# Patient Record
Sex: Female | Born: 1960 | Race: White | Hispanic: No | Smoking: Current every day smoker
Health system: Southern US, Community
[De-identification: ages and names within clinical notes are randomized; demographics above are authoritative.]

## PROBLEM LIST (undated history)

## (undated) DIAGNOSIS — M1712 Unilateral primary osteoarthritis, left knee: Secondary | ICD-10-CM

## (undated) DIAGNOSIS — F410 Panic disorder [episodic paroxysmal anxiety] without agoraphobia: Secondary | ICD-10-CM

## (undated) DIAGNOSIS — F411 Generalized anxiety disorder: Secondary | ICD-10-CM

## (undated) DIAGNOSIS — G43909 Migraine, unspecified, not intractable, without status migrainosus: Secondary | ICD-10-CM

## (undated) DIAGNOSIS — K529 Noninfective gastroenteritis and colitis, unspecified: Secondary | ICD-10-CM

## (undated) DIAGNOSIS — F329 Major depressive disorder, single episode, unspecified: Secondary | ICD-10-CM

## (undated) HISTORY — DX: Panic disorder (episodic paroxysmal anxiety): F41.0

## (undated) HISTORY — DX: Generalized anxiety disorder: F41.1

## (undated) HISTORY — PX: HIP SURGERY: SHX245

## (undated) HISTORY — DX: Unilateral primary osteoarthritis, left knee: M17.12

## (undated) HISTORY — PX: ABDOMINAL HYSTERECTOMY: SHX81

---
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
## 1999-10-29 ENCOUNTER — Encounter: Payer: Self-pay | Admitting: Family Medicine

## 1999-10-29 ENCOUNTER — Encounter: Admission: RE | Admit: 1999-10-29 | Discharge: 1999-10-29 | Payer: Self-pay | Admitting: Family Medicine

## 2001-07-01 ENCOUNTER — Encounter: Payer: Self-pay | Admitting: Emergency Medicine

## 2001-07-01 ENCOUNTER — Emergency Department (HOSPITAL_COMMUNITY): Admission: EM | Admit: 2001-07-01 | Discharge: 2001-07-01 | Payer: Self-pay | Admitting: Emergency Medicine

## 2001-07-20 ENCOUNTER — Ambulatory Visit (HOSPITAL_COMMUNITY): Admission: RE | Admit: 2001-07-20 | Discharge: 2001-07-20 | Payer: Self-pay | Admitting: Family Medicine

## 2001-07-20 ENCOUNTER — Encounter: Payer: Self-pay | Admitting: Family Medicine

## 2003-04-02 ENCOUNTER — Emergency Department (HOSPITAL_COMMUNITY): Admission: EM | Admit: 2003-04-02 | Discharge: 2003-04-03 | Payer: Self-pay | Admitting: Emergency Medicine

## 2003-04-02 ENCOUNTER — Encounter: Payer: Self-pay | Admitting: *Deleted

## 2003-11-19 ENCOUNTER — Emergency Department (HOSPITAL_COMMUNITY): Admission: EM | Admit: 2003-11-19 | Discharge: 2003-11-19 | Payer: Self-pay | Admitting: Emergency Medicine

## 2003-12-29 ENCOUNTER — Emergency Department (HOSPITAL_COMMUNITY): Admission: EM | Admit: 2003-12-29 | Discharge: 2003-12-29 | Payer: Self-pay | Admitting: Emergency Medicine

## 2010-01-08 ENCOUNTER — Ambulatory Visit: Payer: Self-pay | Admitting: Diagnostic Radiology

## 2010-01-08 ENCOUNTER — Emergency Department (HOSPITAL_BASED_OUTPATIENT_CLINIC_OR_DEPARTMENT_OTHER): Admission: EM | Admit: 2010-01-08 | Discharge: 2010-01-08 | Payer: Self-pay | Admitting: Emergency Medicine

## 2012-07-16 DIAGNOSIS — F329 Major depressive disorder, single episode, unspecified: Secondary | ICD-10-CM

## 2012-07-16 DIAGNOSIS — Z800 Family history of malignant neoplasm of digestive organs: Secondary | ICD-10-CM | POA: Insufficient documentation

## 2012-07-16 HISTORY — DX: Major depressive disorder, single episode, unspecified: F32.9

## 2012-07-20 DIAGNOSIS — F17200 Nicotine dependence, unspecified, uncomplicated: Secondary | ICD-10-CM | POA: Insufficient documentation

## 2012-07-20 DIAGNOSIS — K589 Irritable bowel syndrome without diarrhea: Secondary | ICD-10-CM

## 2012-07-20 DIAGNOSIS — Z803 Family history of malignant neoplasm of breast: Secondary | ICD-10-CM | POA: Insufficient documentation

## 2012-07-20 HISTORY — DX: Irritable bowel syndrome, unspecified: K58.9

## 2013-05-13 DIAGNOSIS — E782 Mixed hyperlipidemia: Secondary | ICD-10-CM

## 2013-05-13 HISTORY — DX: Mixed hyperlipidemia: E78.2

## 2013-06-09 DIAGNOSIS — G44209 Tension-type headache, unspecified, not intractable: Secondary | ICD-10-CM

## 2013-06-09 DIAGNOSIS — N952 Postmenopausal atrophic vaginitis: Secondary | ICD-10-CM | POA: Insufficient documentation

## 2013-06-09 HISTORY — DX: Tension-type headache, unspecified, not intractable: G44.209

## 2014-05-05 DIAGNOSIS — N959 Unspecified menopausal and perimenopausal disorder: Secondary | ICD-10-CM | POA: Insufficient documentation

## 2014-09-30 DIAGNOSIS — G43909 Migraine, unspecified, not intractable, without status migrainosus: Secondary | ICD-10-CM | POA: Insufficient documentation

## 2015-02-22 DIAGNOSIS — K219 Gastro-esophageal reflux disease without esophagitis: Secondary | ICD-10-CM | POA: Insufficient documentation

## 2015-02-22 HISTORY — DX: Gastro-esophageal reflux disease without esophagitis: K21.9

## 2015-04-25 DIAGNOSIS — Z8709 Personal history of other diseases of the respiratory system: Secondary | ICD-10-CM

## 2015-04-25 DIAGNOSIS — R059 Cough, unspecified: Secondary | ICD-10-CM | POA: Insufficient documentation

## 2015-04-25 HISTORY — DX: Personal history of other diseases of the respiratory system: Z87.09

## 2016-11-06 ENCOUNTER — Emergency Department (HOSPITAL_BASED_OUTPATIENT_CLINIC_OR_DEPARTMENT_OTHER)
Admission: EM | Admit: 2016-11-06 | Discharge: 2016-11-06 | Disposition: A | Discharge status: Home / Self Care | Payer: Self-pay | Attending: Emergency Medicine | Admitting: Emergency Medicine

## 2016-11-06 ENCOUNTER — Encounter (HOSPITAL_BASED_OUTPATIENT_CLINIC_OR_DEPARTMENT_OTHER): Payer: Self-pay

## 2016-11-06 ENCOUNTER — Emergency Department (HOSPITAL_BASED_OUTPATIENT_CLINIC_OR_DEPARTMENT_OTHER): Payer: Self-pay

## 2016-11-06 DIAGNOSIS — M25561 Pain in right knee: Secondary | ICD-10-CM | POA: Insufficient documentation

## 2016-11-06 DIAGNOSIS — G8929 Other chronic pain: Secondary | ICD-10-CM | POA: Insufficient documentation

## 2016-11-06 DIAGNOSIS — Z79899 Other long term (current) drug therapy: Secondary | ICD-10-CM | POA: Insufficient documentation

## 2016-11-06 DIAGNOSIS — F17210 Nicotine dependence, cigarettes, uncomplicated: Secondary | ICD-10-CM | POA: Insufficient documentation

## 2016-11-06 HISTORY — DX: Major depressive disorder, single episode, unspecified: F32.9

## 2016-11-06 HISTORY — DX: Migraine, unspecified, not intractable, without status migrainosus: G43.909

## 2016-11-06 MED ORDER — DICLOFENAC SODIUM 1 % TD GEL: 4.0000 g | Freq: Four times a day (QID) | TRANSDERMAL | 0 refills | Status: DC

## 2016-11-06 NOTE — ED Notes (Signed)
Discussed dc instructions with pt. Discussed application of pain ointment per ED providers instructions, also discussed how ice and elevation are also beneficial, opportunity for questions provided
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
## 2016-11-06 NOTE — Discharge Instructions (Signed)
Please read and follow all provided instructions.  Your diagnoses today include:  1. Chronic pain of right knee     Tests performed today include:  Vital signs. See below for your results today.   Medications prescribed:   Voltaren - topical medication for joint pain  Take any prescribed medications only as directed.  Home care instructions:   Follow any educational materials contained in this packet  Follow R.I.C.E. Protocol:  R - rest your injury   I  - use ice on injury without applying directly to skin  C - compress injury with bandage or splint  E - elevate the injury as much as possible  Follow-up instructions: Please follow-up with your primary care provider or the provided orthopedic physician as planned. In this case you may have a more severe injury that requires further care.   Return instructions:   Please return if your toes or feet are numb or tingling, appear gray or blue, or you have severe pain (also elevate the leg and loosen splint or wrap if you were given one)  Please return to the Emergency Department if you experience worsening symptoms.   Please return if you have any other emergent concerns.  Additional Information:  Your vital signs today were: There were no vitals taken for this visit. If your blood pressure (BP) was elevated above 135/85 this visit, please have this repeated by your doctor within one month. --------------
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
## 2017-01-28 ENCOUNTER — Encounter (HOSPITAL_COMMUNITY): Payer: Self-pay | Admitting: *Deleted

## 2017-01-28 ENCOUNTER — Emergency Department (HOSPITAL_COMMUNITY)
Admission: EM | Admit: 2017-01-28 | Discharge: 2017-01-28 | Disposition: A | Discharge status: Home / Self Care | Payer: BLUE CROSS/BLUE SHIELD | Attending: Emergency Medicine | Admitting: Emergency Medicine

## 2017-01-28 ENCOUNTER — Emergency Department (HOSPITAL_COMMUNITY): Payer: BLUE CROSS/BLUE SHIELD

## 2017-01-28 DIAGNOSIS — Z7982 Long term (current) use of aspirin: Secondary | ICD-10-CM | POA: Diagnosis not present

## 2017-01-28 DIAGNOSIS — R1032 Left lower quadrant pain: Secondary | ICD-10-CM | POA: Insufficient documentation

## 2017-01-28 DIAGNOSIS — F17210 Nicotine dependence, cigarettes, uncomplicated: Secondary | ICD-10-CM | POA: Insufficient documentation

## 2017-01-28 LAB — CBC
HCT: 38.7 % (ref 36.0–46.0)
HEMOGLOBIN: 13.3 g/dL (ref 12.0–15.0)
MCH: 34.2 pg — AB (ref 26.0–34.0)
MCHC: 34.4 g/dL (ref 30.0–36.0)
MCV: 99.5 fL (ref 78.0–100.0)
Platelets: 190 10*3/uL (ref 150–400)
RBC: 3.89 MIL/uL (ref 3.87–5.11)
RDW: 12.4 % (ref 11.5–15.5)
WBC: 5.1 10*3/uL (ref 4.0–10.5)

## 2017-01-28 LAB — COMPREHENSIVE METABOLIC PANEL
ALBUMIN: 3.5 g/dL (ref 3.5–5.0)
ALT: 21 U/L (ref 14–54)
ANION GAP: 7 (ref 5–15)
AST: 22 U/L (ref 15–41)
Alkaline Phosphatase: 61 U/L (ref 38–126)
BILIRUBIN TOTAL: 0.6 mg/dL (ref 0.3–1.2)
BUN: 17 mg/dL (ref 6–20)
CO2: 23 mmol/L (ref 22–32)
Calcium: 8.4 mg/dL — ABNORMAL LOW (ref 8.9–10.3)
Chloride: 110 mmol/L (ref 101–111)
Creatinine, Ser: 0.7 mg/dL (ref 0.44–1.00)
GFR calc Af Amer: >60 mL/min (ref >60)
GFR calc non Af Amer: >60 mL/min (ref >60)
GLUCOSE: 101 mg/dL — AB (ref 65–99)
POTASSIUM: 4 mmol/L (ref 3.5–5.1)
SODIUM: 140 mmol/L (ref 135–145)
TOTAL PROTEIN: 6.4 g/dL — AB (ref 6.5–8.1)

## 2017-01-28 LAB — URINALYSIS, ROUTINE W REFLEX MICROSCOPIC
Bilirubin Urine: NEGATIVE
Glucose, UA: NEGATIVE mg/dL
Hgb urine dipstick: NEGATIVE
KETONES UR: NEGATIVE mg/dL
Nitrite: NEGATIVE
PROTEIN: NEGATIVE mg/dL
Specific Gravity, Urine: 1.012 (ref 1.005–1.030)
pH: 5 (ref 5.0–8.0)

## 2017-01-28 LAB — LIPASE, BLOOD: Lipase: 36 U/L (ref 11–51)

## 2017-01-28 MED ORDER — IOPAMIDOL (ISOVUE-300) INJECTION 61%
INTRAVENOUS | Status: AC
  Administered 2017-01-28: 100 mL via INTRAVENOUS
  Filled 2017-01-28: qty 100

## 2017-01-28 MED ORDER — SODIUM CHLORIDE 0.9 % IV BOLUS (SEPSIS)
1000.0000 mL | Freq: Once | INTRAVENOUS | Status: AC
  Administered 2017-01-28: 1000 mL via INTRAVENOUS

## 2017-01-28 MED ORDER — ONDANSETRON 8 MG PO TBDP: 8.0000 mg | ORAL_TABLET | Freq: Three times a day (TID) | ORAL | 0 refills | Status: DC | PRN

## 2017-01-28 MED ORDER — HYDROCODONE-ACETAMINOPHEN 5-325 MG PO TABS: 1.0000 | ORAL_TABLET | Freq: Four times a day (QID) | ORAL | 0 refills | Status: DC | PRN

## 2017-01-28 MED ORDER — ONDANSETRON HCL 4 MG/2ML IJ SOLN
4.0000 mg | Freq: Once | INTRAMUSCULAR | Status: AC
  Administered 2017-01-28: 4 mg via INTRAVENOUS
  Filled 2017-01-28: qty 2

## 2017-01-28 MED ORDER — HYDROMORPHONE HCL 1 MG/ML IJ SOLN
0.5000 mg | Freq: Once | INTRAMUSCULAR | Status: AC
  Administered 2017-01-28: 0.5 mg via INTRAVENOUS
  Filled 2017-01-28: qty 1

## 2017-01-28 NOTE — Discharge Instructions (Signed)
Your CT scan showed a left ovarian cyst, otherwise unremarkable. Follow-up with your OB/GYN regarding this finding. Take Zofran as prescribed as needed for nausea and vomiting. Take Norco for severe pain only. Start with oral fluids, advanced solids as tolerated. Follow-up with your doctor. Return if worsening.

## 2017-01-28 NOTE — ED Triage Notes (Signed)
Per EMS, pt from home complains of LLQ abdominal pain x days. Pt has hx of colitis. Pt complains of weakness, dizziness as well x 5 days. Pt last vomited this morning. Pt states she feels dehydrated. Pt given NS. Pt NSR on 12 lead.   BP 140/81 HR 85 RR 16 97 on RA CB G110

## 2017-01-28 NOTE — ED Notes (Signed)
Bed: WA07 Expected date:  Expected time:  Means of arrival:  Comments: EMS abdominal

## 2017-01-28 NOTE — ED Notes (Signed)
Made request for a urine sample,patient unable to provide one at this time.

## 2017-01-28 NOTE — ED Provider Notes (Signed)
[facility]-EMERGENCY DEPT Provider Note   CSN: [id number] Arrival date & time: 01/28/17  1531     History   Chief Complaint Chief Complaint  Patient presents with  . Abdominal Pain    HPI Nikki Glanzer is a 56 y.o. female.  HPI  Zoeya Gramajo is a 57 y.o. female with a history of colitis, depression, migraine headaches, presents to emergency department complaining of abdominal pain. Patient states her pain started 6 days ago. Reports pain in the left lower quadrant. States 5 days ago had several hours of watery diarrhea which has now resolved. She reports associated nausea vomiting over the last 5 days. She states she is able to keep sips of water down but unable to eat any solids. She states pain is worsening. She reports initially thinking this was just a GI bug, but states this feels more like her colitis which she had 6 months ago and for which she was admitted. Denies following up for colonoscopy after her admission. She has tried taking antacids and MiraLAX with no relief of her symptoms. She reports generalized weakness and feeling of dehydration. She denies any blood in her stool or emesis. She has not had a bowel movement in the last 4 days. She is passing gas. She denies any history of abdominal surgeries other than hysterectomy. She reports subjective fever at home, states her thermometer is broken. Denies any back pain. No urinary symptoms. Nothing making her symptoms better or worse   Past Medical History:  Diagnosis Date  . Depression   . Migraine     There are no active problems to display for this patient.   Past Surgical History:  Procedure Laterality Date  . ABDOMINAL HYSTERECTOMY    . HIP SURGERY      OB History    No data available       Home Medications    Prior to Admission medications   Medication Sig Start Date End Date Taking? Authorizing Provider  Aspirin-Acetaminophen-Caffeine (GOODY HEADACHE PO) Take 1 packet by mouth as needed (pain,headache).    Yes Historical Provider, MD  buPROPion (WELLBUTRIN XL) 300 MG 24 hr tablet Take 300 mg by mouth daily. 11/13/16  Yes Historical Provider, MD  clonazePAM (KLONOPIN) 0.5 MG tablet Take 0.5 mg by mouth 2 (two) times daily as needed for anxiety. 12/09/16  Yes Historical Provider, MD  diclofenac sodium (VOLTAREN) 1 % GEL Apply 4 g topically 4 (four) times daily. 11/06/16  Yes Renne Crigler, PA-C  FLUoxetine (PROZAC) 40 MG capsule Take 40 mg by mouth daily. 11/13/16  Yes Historical Provider, MD  ibuprofen (ADVIL,MOTRIN) 200 MG tablet Take 400-600 mg by mouth every 6 (six) hours as needed.   Yes Historical Provider, MD    Family History No family history on file.  Social History Social History  Substance Use Topics  . Smoking status: Current Every Day Smoker    Types: Cigarettes  . Smokeless tobacco: Never Used  . Alcohol use Yes     Comment: occ     Allergies   Morphine and related and Tramadol   Review of Systems Review of Systems  Constitutional: Positive for chills, fatigue and fever.  Respiratory: Negative for cough, chest tightness and shortness of breath.   Cardiovascular: Negative for chest pain, palpitations and leg swelling.  Gastrointestinal: Positive for abdominal pain, constipation, diarrhea, nausea and vomiting.  Genitourinary: Negative for dysuria, flank pain, pelvic pain, vaginal bleeding, vaginal discharge and vaginal pain.  Musculoskeletal: Negative for arthralgias, myalgias, neck pain  and neck stiffness.  Skin: Negative for rash.  Neurological: Positive for dizziness and weakness. Negative for headaches.  All other systems reviewed and are negative.    Physical Exam Updated Vital Signs BP (!) 141/91 (BP Location: Right Arm)   Pulse 85   Temp 98.1 F (36.7 C)   Resp 18   SpO2 100%   Physical Exam  Constitutional: She is oriented to person, place, and time. She appears well-developed and well-nourished. No distress.  HENT:  Head: Normocephalic.  Eyes:  Conjunctivae are normal.  Neck: Neck supple.  Cardiovascular: Normal rate, regular rhythm and normal heart sounds.   Pulmonary/Chest: Effort normal and breath sounds normal. No respiratory distress. She has no wheezes. She has no rales.  Abdominal: Soft. Bowel sounds are normal. She exhibits no distension. There is tenderness. There is no rebound.  LLQ tenderness. No guarding. No cva tenderness bilaterally  Musculoskeletal: She exhibits no edema.  Neurological: She is alert and oriented to person, place, and time.  Skin: Skin is warm and dry.  Psychiatric: She has a normal mood and affect. Her behavior is normal.  Nursing note and vitals reviewed.    ED Treatments / Results  Labs (all labs ordered are listed, but only abnormal results are displayed) Labs Reviewed  COMPREHENSIVE METABOLIC PANEL - Abnormal; Notable for the following:       Result Value   Glucose, Bld 101 (*)    Calcium 8.4 (*)    Total Protein 6.4 (*)    All other components within normal limits  CBC - Abnormal; Notable for the following:    MCH 34.2 (*)    All other components within normal limits  LIPASE, BLOOD  URINALYSIS, ROUTINE W REFLEX MICROSCOPIC    EKG  EKG Interpretation None       Radiology No results found.  Procedures Procedures (including critical care time)  Medications Ordered in ED Medications  ondansetron (ZOFRAN) injection 4 mg (not administered)  HYDROmorphone (DILAUDID) injection 0.5 mg (not administered)  sodium chloride 0.9 % bolus 1,000 mL (not administered)     Initial Impression / Assessment and Plan / ED Course  I have reviewed the triage vital signs and the nursing notes.  Pertinent labs & imaging results that were available during my care of the patient were reviewed by me and considered in my medical decision making (see chart for details).     Pt in ED with left lower quadrant abd pain, nausea, vomiting. Hx of colitis. Will get labs, UA, CT abd/pelvs. Fluids,  anti emetics, pain medications ordered.   6:12 PM Labs unremarkable. Patient feels better after 0.5 mg of IV Dilaudid. Patient is waiting on CT scan.  7:38 PM Pt's CT scan showing left ovarian cyst, which pt is aware of and follows up with OB./GYN. CT otherwise unremarkable. She is feeling better. She tolerated POs. Her white blood cell count is normal. No evidence of UTI. Does not have uterus, denies any vaginal complaints. Plan to dc home with pain medications, antiemetics, follow up with pcp.   Vitals:   01/28/17 1541 01/28/17 1929  BP: (!) 141/91 (!) 139/96  Pulse: 85 75  Resp: 18 14  Temp: 98.1 F (36.7 C)   SpO2: 100% 100%     Final Clinical Impressions(s) / ED Diagnoses   Final diagnoses:  Left lower quadrant pain    New Prescriptions New Prescriptions   No medications on file     Jaynie Crumble, PA-C 01/28/17 1940  Azalia Bilis, MD 01/29/17 1057

## 2017-03-09 ENCOUNTER — Emergency Department (HOSPITAL_COMMUNITY): Payer: BLUE CROSS/BLUE SHIELD

## 2017-03-09 ENCOUNTER — Encounter (HOSPITAL_COMMUNITY): Payer: Self-pay | Admitting: Emergency Medicine

## 2017-03-09 ENCOUNTER — Emergency Department (HOSPITAL_COMMUNITY)
Admission: EM | Admit: 2017-03-09 | Discharge: 2017-03-09 | Disposition: A | Discharge status: Home / Self Care | Payer: BLUE CROSS/BLUE SHIELD | Attending: Emergency Medicine | Admitting: Emergency Medicine

## 2017-03-09 DIAGNOSIS — K2950 Unspecified chronic gastritis without bleeding: Secondary | ICD-10-CM

## 2017-03-09 DIAGNOSIS — R197 Diarrhea, unspecified: Secondary | ICD-10-CM

## 2017-03-09 DIAGNOSIS — Z7982 Long term (current) use of aspirin: Secondary | ICD-10-CM | POA: Diagnosis not present

## 2017-03-09 DIAGNOSIS — N83202 Unspecified ovarian cyst, left side: Secondary | ICD-10-CM | POA: Diagnosis not present

## 2017-03-09 DIAGNOSIS — R1032 Left lower quadrant pain: Secondary | ICD-10-CM

## 2017-03-09 DIAGNOSIS — Z79899 Other long term (current) drug therapy: Secondary | ICD-10-CM | POA: Diagnosis not present

## 2017-03-09 DIAGNOSIS — F17210 Nicotine dependence, cigarettes, uncomplicated: Secondary | ICD-10-CM | POA: Diagnosis not present

## 2017-03-09 DIAGNOSIS — R112 Nausea with vomiting, unspecified: Secondary | ICD-10-CM

## 2017-03-09 HISTORY — DX: Noninfective gastroenteritis and colitis, unspecified: K52.9

## 2017-03-09 LAB — URINALYSIS, ROUTINE W REFLEX MICROSCOPIC
BILIRUBIN URINE: NEGATIVE
Glucose, UA: NEGATIVE mg/dL
Hgb urine dipstick: NEGATIVE
KETONES UR: 5 mg/dL — AB
LEUKOCYTES UA: NEGATIVE
NITRITE: NEGATIVE
PH: 5 (ref 5.0–8.0)
Protein, ur: NEGATIVE mg/dL
SPECIFIC GRAVITY, URINE: 1.018 (ref 1.005–1.030)

## 2017-03-09 LAB — COMPREHENSIVE METABOLIC PANEL
ALK PHOS: 71 U/L (ref 38–126)
ALT: 15 U/L (ref 14–54)
ANION GAP: 9 (ref 5–15)
AST: 20 U/L (ref 15–41)
Albumin: 4.2 g/dL (ref 3.5–5.0)
BILIRUBIN TOTAL: 0.8 mg/dL (ref 0.3–1.2)
BUN: 17 mg/dL (ref 6–20)
CALCIUM: 9.6 mg/dL (ref 8.9–10.3)
CO2: 25 mmol/L (ref 22–32)
Chloride: 108 mmol/L (ref 101–111)
Creatinine, Ser: 0.77 mg/dL (ref 0.44–1.00)
GFR calc non Af Amer: >60 mL/min (ref >60)
Glucose, Bld: 95 mg/dL (ref 65–99)
POTASSIUM: 4.1 mmol/L (ref 3.5–5.1)
Sodium: 142 mmol/L (ref 135–145)
TOTAL PROTEIN: 7.2 g/dL (ref 6.5–8.1)

## 2017-03-09 LAB — CBC
HEMATOCRIT: 41.7 % (ref 36.0–46.0)
HEMOGLOBIN: 14.6 g/dL (ref 12.0–15.0)
MCH: 35.1 pg — ABNORMAL HIGH (ref 26.0–34.0)
MCHC: 35 g/dL (ref 30.0–36.0)
MCV: 100.2 fL — ABNORMAL HIGH (ref 78.0–100.0)
Platelets: 232 10*3/uL (ref 150–400)
RBC: 4.16 MIL/uL (ref 3.87–5.11)
RDW: 12.5 % (ref 11.5–15.5)
WBC: 8 10*3/uL (ref 4.0–10.5)

## 2017-03-09 LAB — LIPASE, BLOOD: Lipase: 29 U/L (ref 11–51)

## 2017-03-09 MED ORDER — FAMOTIDINE IN NACL 20-0.9 MG/50ML-% IV SOLN
20.0000 mg | Freq: Once | INTRAVENOUS | Status: AC
  Administered 2017-03-09: 20 mg via INTRAVENOUS
  Filled 2017-03-09: qty 50

## 2017-03-09 MED ORDER — ONDANSETRON 4 MG PO TBDP: 4.0000 mg | ORAL_TABLET | Freq: Three times a day (TID) | ORAL | 0 refills | Status: DC | PRN

## 2017-03-09 MED ORDER — SODIUM CHLORIDE 0.9 % IV BOLUS (SEPSIS)
1000.0000 mL | Freq: Once | INTRAVENOUS | Status: AC
  Administered 2017-03-09: 1000 mL via INTRAVENOUS

## 2017-03-09 MED ORDER — ONDANSETRON HCL 4 MG/2ML IJ SOLN
4.0000 mg | Freq: Once | INTRAMUSCULAR | Status: AC
  Administered 2017-03-09: 4 mg via INTRAVENOUS
  Filled 2017-03-09: qty 2

## 2017-03-09 MED ORDER — GI COCKTAIL ~~LOC~~
30.0000 mL | Freq: Once | ORAL | Status: AC
  Administered 2017-03-09: 30 mL via ORAL
  Filled 2017-03-09: qty 30

## 2017-03-09 MED ORDER — RANITIDINE HCL 150 MG PO TABS: 150.0000 mg | ORAL_TABLET | Freq: Two times a day (BID) | ORAL | 0 refills | Status: DC

## 2017-03-09 MED ORDER — DICYCLOMINE HCL 10 MG/ML IM SOLN
20.0000 mg | Freq: Once | INTRAMUSCULAR | Status: AC
  Administered 2017-03-09: 20 mg via INTRAMUSCULAR
  Filled 2017-03-09: qty 2

## 2017-03-09 MED ORDER — DICYCLOMINE HCL 20 MG PO TABS: 20.0000 mg | ORAL_TABLET | Freq: Two times a day (BID) | ORAL | 0 refills | Status: DC

## 2017-03-09 MED ORDER — KETOROLAC TROMETHAMINE 30 MG/ML IJ SOLN
15.0000 mg | Freq: Once | INTRAMUSCULAR | Status: AC
  Administered 2017-03-09: 15 mg via INTRAVENOUS
  Filled 2017-03-09: qty 1

## 2017-03-09 NOTE — ED Notes (Signed)
Patient reports having lower left ovarian pain, due to a cyst.  She is in constant pain.

## 2017-03-09 NOTE — Discharge Instructions (Signed)
You have an ovarian cyst, which is just slightly larger than your last ultrasound, however it's overall stable and just needs to be monitored. It could be contributing to your pain. Use heat to the area of pain. Follow up with your OBGYN in 1 week for recheck of symptoms and ongoing management of your cyst. [facility]) for any emergent changes or worsening symptoms.   Your abdominal pain could also be related to gastritis or an ulcer, or even something called IBS (irritable bowel syndrome). You will need to take zantac as directed, and avoid spicy/fatty/acidic foods, avoid soda/coffee/tea/alcohol. Avoid laying down flat within 30 minutes of eating. Avoid NSAIDs like ibuprofen/aleve/motrin/etc on an empty stomach. May consider using over the counter tums/maalox as needed for additional relief. Use zofran as directed as needed for nausea. Use tylenol and bentyl as directed as needed for pain and diarrhea. Stay well hydrated with small sips of fluids throughout the day. Follow a BRAT (banana-rice-applesauce-toast) diet as described below for the next 24-48 hours. The 'BRAT' diet is suggested, then progress to diet as tolerated as symptoms abate. Call your regular doctor if bloody stools, persistent diarrhea, vomiting, fever or abdominal pain. Follow up with your regular doctor in 1 week for recheck of symptoms, and with your surgeon in order to have your colonoscopy done. Return to ER for changing or worsening of symptoms.   Abdominal (belly) pain can be caused by many things. Your caregiver performed an examination and possibly ordered blood/urine tests and imaging (CT scan, x-rays, ultrasound). Many cases can be observed and treated at home after initial evaluation in the emergency department. Even though you are being discharged home, abdominal pain can be unpredictable. Therefore, you need a repeated exam if your pain does not resolve, returns, or worsens. Most patients  with abdominal pain don't have to be admitted to the hospital or have surgery, but serious problems like appendicitis and gallbladder attacks can start out as nonspecific pain. Many abdominal conditions cannot be diagnosed in one visit, so follow-up evaluations are very important. SEEK IMMEDIATE MEDICAL ATTENTION IF YOU DEVELOP ANY OF THE FOLLOWING SYMPTOMS: The pain does not go away or becomes severe.  A temperature above 101 develops.  Repeated vomiting occurs (multiple episodes).  The pain becomes localized to portions of the abdomen. The right side could possibly be appendicitis. In an adult, the left lower portion of the abdomen could be colitis or diverticulitis.  Blood is being passed in stools or vomit (bright red or black tarry stools).  Return also if you develop chest pain, difficulty breathing, dizziness or fainting, or become confused, poorly responsive, or inconsolable (young children). The constipation stays for more than 4 days.  There is belly (abdominal) or rectal pain.  You do not seem to be getting better.

## 2017-03-09 NOTE — ED Provider Notes (Signed)
[facility]-EMERGENCY DEPT Provider Note   CSN: [id number] Arrival date & time: 03/09/17  1221     History   Chief Complaint No chief complaint on file.   HPI Allison Le is a 56 y.o. female with a PMHx of L ovarian cyst, colitis, migraines, and depression, with a PSHx of abd hysterectomy, who presents to the ED with complaints of recurrent left lower quadrant pain that began 2 days ago, with associated nausea, vomiting, and diarrhea. Patient states this has been a recurrent issue for the last 9 months, and she believes that she has either colitis or her left ovarian cyst is causing problems. Patient states that 9 months ago she was hospitalized in [facility] for "colitis" that was treated with antibiotics before she was discharged with instructions to follow-up for colonoscopy. Chart review reveals that she went to general surgery at [facility] health care in 12/2016 for consultation for a colonoscopy, but never actually had it done, which she states was due to financial issues. She states that she has not followed up with anybody for her recurrent pain, and has never seen a gastroenterologist. She states that the pain typically starts after she eats food, and then becomes a cycle of having nausea and vomiting, then a sensation to have a bowel movement which causes pain, and after the bowel movement pain improves until the next time that she feels sensation to have a bowel movement again when the pain returns. It always happens in this type of cycle. She reports that she's had 4 episodes of NBNB emesis in the last 24hrs, which always occurs after any food consumption, and she's had ~6-7 episodes of nonbloody diarrhea that usually starts out "hard" and then progresses to a softer "mushy" stool, and then ultimately ending in watery stools. She states it occurs "until the food passes through my system", and once it does, the n/v/d and pain cycle stops. She describes the pain today is 8/10 intermittent crampy  LLQ pain which is nonradiating, worse with eating and eating a bowel movement, and unrelieved with ibuprofen and Goody's powders however improved after having a bowel movement. She also reports that her left ovarian cyst is also causing some achy/throbbing and shooting LLQ pain but that is different than the crampy pain she experiences during the cycles of pain. She states this is the same thing as the last ED visit on 01/28/17. Chart review reveals she was seen in the ED on 01/28/17 and had a CT abd/pelv which revealed mild GE junction thickening likely related to esophageal gastritis, and a 1.7cm L ovarian cyst that recommended nonemergent outpatient pelvic U/S for further evaluation. Pt did not follow up after that visit for any further evaluation of this finding, but states she had a pelvic U/S in [place] prior to that visit and was told that her "ovary needed to come out" (chart review appears to show that she was seen at [facility] care OBGYN 11/14/16, had 1.9cm simple L ovarian cyst, recommended f/up in 6 months vs surgical removal if she wanted, but pt declined at that time).  She has not seen the OBGYN for evaluation of removing this ovary/cyst. She admits to being a social alcohol drinker, last had 1 glass of wine about 3-4 days ago, right before the onset of her symptoms. She also admits to regular NSAID use.   She denies fevers, chills, CP, SOB, constipation, obstipation, melena, hematochezia, hematemesis, hematuria, dysuria, vaginal bleeding/discharge, myalgias, arthralgias, numbness, tingling, focal weakness, or any other  complaints at this time. Denies recent travel, sick contacts, suspicious food intake, or recent abx.    The history is provided by the patient and medical records. No language interpreter was used.  Abdominal Cramping  This is a recurrent problem. The current episode started 2 days ago. The problem occurs daily. The problem has not changed since onset.Associated symptoms include  abdominal pain. Pertinent negatives include no chest pain and no shortness of breath. The symptoms are aggravated by eating (and needing to BM). Relieved by: having BM. Treatments tried: ibuprofen and goody's powders. The treatment provided no relief.    Past Medical History:  Diagnosis Date  . Colitis   . Depression   . Migraine     There are no active problems to display for this patient.   Past Surgical History:  Procedure Laterality Date  . ABDOMINAL HYSTERECTOMY    . HIP SURGERY      OB History    No data available       Home Medications    Prior to Admission medications   Medication Sig Start Date End Date Taking? Authorizing Provider  Aspirin-Acetaminophen-Caffeine (GOODY HEADACHE PO) Take 1 packet by mouth as needed (pain,headache).    [provider]  buPROPion (WELLBUTRIN XL) 300 MG 24 hr tablet Take 300 mg by mouth daily. 11/13/16   [provider]  clonazePAM (KLONOPIN) 0.5 MG tablet Take 0.5 mg by mouth 2 (two) times daily as needed for anxiety. 12/09/16   [provider]  diclofenac sodium (VOLTAREN) 1 % GEL Apply 4 g topically 4 (four) times daily. 11/06/16   Renne Crigler, PA-C  FLUoxetine (PROZAC) 40 MG capsule Take 40 mg by mouth daily. 11/13/16   [provider]  HYDROcodone-acetaminophen (NORCO) 5-325 MG tablet Take 1 tablet by mouth every 6 (six) hours as needed for moderate pain. 01/28/17   Kirichenko, Tatyana, PA-C  ibuprofen (ADVIL,MOTRIN) 200 MG tablet Take 400-600 mg by mouth every 6 (six) hours as needed.    [provider]  ondansetron (ZOFRAN ODT) 8 MG disintegrating tablet Take 1 tablet (8 mg total) by mouth every 8 (eight) hours as needed for nausea or vomiting. 01/28/17   Jaynie Crumble, PA-C    Family History No family history on file.  Social History Social History  Substance Use Topics  . Smoking status: Current Every Day Smoker    Types: Cigarettes  . Smokeless tobacco: Never Used  . Alcohol  use Yes     Comment: occ     Allergies   Morphine and related and Tramadol   Review of Systems Review of Systems  Constitutional: Negative for chills and fever.  Respiratory: Negative for shortness of breath.   Cardiovascular: Negative for chest pain.  Gastrointestinal: Positive for abdominal pain, diarrhea, nausea and vomiting. Negative for blood in stool and constipation.  Genitourinary: Negative for dysuria, hematuria, vaginal bleeding and vaginal discharge.  Musculoskeletal: Negative for arthralgias and myalgias.  Skin: Negative for color change.  Allergic/Immunologic: Negative for immunocompromised state.  Neurological: Negative for weakness and numbness.  Psychiatric/Behavioral: Negative for confusion.   All other systems reviewed and are negative for acute change except as noted in the HPI.    Physical Exam Updated Vital Signs BP 137/90 (BP Location: Left Arm)   Pulse 90   Temp 98 F (36.7 C) (Oral)   Resp 18   SpO2 100%   Physical Exam  Constitutional: She is oriented to person, place, and time. Vital signs are normal. She appears  well-developed and well-nourished.  Non-toxic appearance. No distress.  Afebrile, nontoxic, NAD  HENT:  Head: Normocephalic and atraumatic.  Mouth/Throat: Oropharynx is clear and moist and mucous membranes are normal.  Eyes: Conjunctivae and EOM are normal. Right eye exhibits no discharge. Left eye exhibits no discharge.  Neck: Normal range of motion. Neck supple.  Cardiovascular: Normal rate, regular rhythm, normal heart sounds and intact distal pulses.  Exam reveals no gallop and no friction rub.   No murmur heard. Pulmonary/Chest: Effort normal and breath sounds normal. No respiratory distress. She has no decreased breath sounds. She has no wheezes. She has no rhonchi. She has no rales.  Abdominal: Soft. Normal appearance and bowel sounds are normal. She exhibits no distension. There is tenderness in the epigastric area and left lower  quadrant. There is no rigidity, no rebound, no guarding, no CVA tenderness, no tenderness at McBurney's point and negative Murphy's sign.    Soft, nondistended, +BS throughout, with mild LLQ TTP and epigastric TTP, no r/g/r, neg murphy's, neg mcburney's, no CVA TTP   Genitourinary:  Genitourinary Comments: Pt declined  Musculoskeletal: Normal range of motion.  Neurological: She is alert and oriented to person, place, and time. She has normal strength. No sensory deficit.  Skin: Skin is warm, dry and intact. No rash noted.  Psychiatric: She has a normal mood and affect.  Nursing note and vitals reviewed.    ED Treatments / Results  Labs (all labs ordered are listed, but only abnormal results are displayed) Labs Reviewed  CBC - Abnormal; Notable for the following:       Result Value   MCV 100.2 (*)    MCH 35.1 (*)    All other components within normal limits  URINALYSIS, ROUTINE W REFLEX MICROSCOPIC - Abnormal; Notable for the following:    Ketones, ur 5 (*)    All other components within normal limits  LIPASE, BLOOD  COMPREHENSIVE METABOLIC PANEL    EKG  EKG Interpretation None       Radiology US Transvaginal Non-ob  Result Date: 03/09/2017 CLINICAL DATA:  56 year old female with left pelvic pain. History of hysterectomy. EXAM: TRANSABDOMINAL AND TRANSVAGINAL ULTRASOUND OF PELVIS DOPPLER ULTRASOUND OF OVARIES TECHNIQUE: Both transabdominal and transvaginal ultrasound examinations of the pelvis were performed. Transabdominal technique was performed for global imaging of the pelvis including the ovaries, adnexal regions, and pelvic cul-de-sac. It was necessary to proceed with endovaginal exam following the transabdominal exam to visualize the ovaries and adnexal regions. Color and duplex Doppler ultrasound was utilized to evaluate blood flow to the ovaries. COMPARISON:  01/28/2017 CT FINDINGS: Uterus Not visualized compatible with hysterectomy. Right ovary Measurements: 1.5 x 2.3  x 2 cm. Normal appearance/no adnexal mass. Left ovary Measurements: 2.6 x 2.1 x 2.1 cm. A 2.4 x 1.9 x 1.9 cm cyst with thin septations noted. Pulsed Doppler evaluation of both ovaries demonstrates normal low-resistance arterial and venous waveforms. Other findings No abnormal free fluid. IMPRESSION: No evidence of ovarian torsion. 2.4 cm left ovarian cyst. This is almost certainly benign, but follow up ultrasound is recommended in 1 year according to the Society of Radiologists in Ultrasound2010 Consensus Conference Statement (D Lenis Noon et al. Management of Asymptomatic Ovarian and Other Adnexal Cysts Imaged at [place]: Society of Radiologists in Ultrasound Consensus Conference Statement 2010. Radiology 256 (Sept 2010): 943-954.). Normal right ovary.  Status post hysterectomy. Electronically Signed   By: Harmon Pier M.D.   On: 03/09/2017 15:50   US Pelvis Complete  Result Date: 03/09/2017  CLINICAL DATA:  56 year old female with left pelvic pain. History of hysterectomy. 1XAM: TRANSABDOMINAL AND TRANSVAGINAL ULTRASOUND OF PELVIS DOPPLER ULTRASOUND OF OVARIES TECHNIQUE: Both transabdominal and transvaginal ultrasound examinations of the pelvis were performed. Transabdominal technique was performed for global imaging of the pelvis including the ovaries, adnexal regions, and pelvic cul-de-sac. It was necessary to proceed with endovaginal exam following the transabdominal exam to visualize the ovaries and adnexal regions. Color and duplex Doppler ultrasound was utilized to evaluate blood flow to the ovaries. COMPARISON:  01/28/2017 CT FINDINGS: Uterus Not visualized compatible with hysterectomy. Right ovary Measurements: 1.5 x 2.3 x 2 cm. Normal appearance/no adnexal mass. Left ovary Measurements: 2.6 x 2.1 x 2.1 cm. A 2.4 x 1.9 x 1.9 cm cyst with thin septations noted. Pulsed Doppler evaluation of both ovaries demonstrates normal low-resistance arterial and venous waveforms. Other findings No abnormal free fluid.  IMPRESSION: No evidence of ovarian torsion. 2.4 cm left ovarian cyst. This is almost certainly benign, but follow up ultrasound is recommended in 1 year according to the Society of Radiologists in Ultrasound2010 Consensus Conference Statement (D Lenis NoonLevine et al. Management of Asymptomatic Ovarian and Other Adnexal Cysts Imaged at [place]: Society of Radiologists in Ultrasound Consensus Conference Statement 2010. Radiology 256 (Sept 2010): 943-954.). Normal right ovary.  Status post hysterectomy. Electronically Signed   By: Harmon PierJeffrey  Hu M.D.   On: 03/09/2017 15:50   [place] Art/ven Flow Abd Pelv Doppler  Result Date: 03/09/2017 CLINICAL DATA:  56 year old female with left pelvic pain. History of hysterectomy. EXAM: TRANSABDOMINAL AND TRANSVAGINAL ULTRASOUND OF PELVIS DOPPLER ULTRASOUND OF OVARIES TECHNIQUE: Both transabdominal and transvaginal ultrasound examinations of the pelvis were performed. Transabdominal technique was performed for global imaging of the pelvis including the ovaries, adnexal regions, and pelvic cul-de-sac. It was necessary to proceed with endovaginal exam following the transabdominal exam to visualize the ovaries and adnexal regions. Color and duplex Doppler ultrasound was utilized to evaluate blood flow to the ovaries. COMPARISON:  01/28/2017 CT FINDINGS: Uterus Not visualized compatible with hysterectomy. Right ovary Measurements: 1.5 x 2.3 x 2 cm. Normal appearance/no adnexal mass. Left ovary Measurements: 2.6 x 2.1 x 2.1 cm. A 2.4 x 1.9 x 1.9 cm cyst with thin septations noted. Pulsed Doppler evaluation of both ovaries demonstrates normal low-resistance arterial and venous waveforms. Other findings No abnormal free fluid. IMPRESSION: No evidence of ovarian torsion. 2.4 cm left ovarian cyst. This is almost certainly benign, but follow up ultrasound is recommended in 1 year according to the Society of Radiologists in Ultrasound2010 Consensus Conference Statement (D Lenis NoonLevine et al. Management of  Asymptomatic Ovarian and Other Adnexal Cysts Imaged at [place]: Society of Radiologists in Ultrasound Consensus Conference Statement 2010. Radiology 256 (Sept 2010): [phone].). Normal right ovary.  Status post hysterectomy. Electronically Signed   By: Harmon PierJeffrey  Hu M.D.   On: 03/09/2017 15:50     01/28/17 CT abd/pelv: IMPRESSION: 1. No CT evidence for acute intra-abdominal or pelvic pathology 2. Possible mild thickening at the GE junction, could relate to esophago gastritis. 3. 1.7 cm low-density probable cyst in the left adnexa. Recommend further evaluation with nonemergent pelvic ultrasound.  Electronically Signed   By: Jasmine PangKim  Fujinaga M.D.   On: 01/28/2017 19:19   Procedures Procedures (including critical care time)  Medications Ordered in ED Medications  dicyclomine (BENTYL) injection 20 mg (20 mg Intramuscular Given 03/09/17 1435)  sodium chloride 0.9 % bolus 1,000 mL (1,000 mLs Intravenous New Bag/Given 03/09/17 1435)  ondansetron (ZOFRAN) injection 4 mg (4 mg Intravenous Given  03/09/17 1434)  ketorolac (TORADOL) 30 MG/ML injection 15 mg (15 mg Intravenous Given 03/09/17 1434)  famotidine (PEPCID) IVPB 20 mg premix (0 mg Intravenous Stopped 03/09/17 1509)  gi cocktail (Maalox,Lidocaine,Donnatal) (30 mLs Oral Given 03/09/17 1433)     Initial Impression / Assessment and Plan / ED Course  I have reviewed the triage vital signs and the nursing notes.  Pertinent labs & imaging results that were available during my care of the patient were reviewed by me and considered in my medical decision making (see chart for details).     56 y.o. female here with recurrent LLQ pain and n/v/d that began 2 days ago, seems to be triggered by food, pain cycles around needing to have BM and improves after BM. States she had a hx of colitis 9 months ago at outside hospital, was treated with abx, never had colonoscopy afterwards although she was advised to and actually made appt with gen surg for this back in  April. Was seen here on 01/28/17 for similar complaints, had CT abd/pelv which showed gastritis and 1.7cm L ovarian cyst. Has not yet followed back up since then regarding her cyst. On exam, mild LLQ TTP and epigastric TTP, nonperitoneal, neg murphy's and mcburney's. No flank tenderness. Labs reveal: lipase WNL, CMP WNL, CBC essentially WNL (MCV and MCH high, but remainder of CBC WNL). U/A not yet done. Story sounds most consistent with IBS, with concomitant gastritis as well. Highly doubt need for repeat CT scan today, given reassuring labs and nonsurgical abd exam. Will give bentyl, pepcid, toradol, zofran, fluids, and GI cocktail, and get pelvic/transvaginal U/S. Of note, pt declined pelvic exam, would prefer to just have pelvic U/S done to evaluate the L ovarian cyst, which I think is reasonable since pelvic examination on this pt would likely not add much to her overall assessment. Will await U/A and U/S results then reassess shortly  4:40 PM U/A completely unremarkable aside from 5 ketones, but otherwise no evidence infection or other concerning findings. U/S demonstrating L ovarian cyst again, now 2.4cm which is just slightly larger than the U/S from February done at [facility]; no other concerning findings. Her symptoms are likely related to this, compiled with gastritis/gastroenteritis, and possibly IBS although she'd need colonoscopy first to r/o other etiologies of her recurrent abd pain/n/v/d. Pt feeling somewhat better, nausea resolved, pain slightly improved, and tolerating PO well. Will d/c home with bentyl, zofran, and zantac. Advised staying well hydrated, BRAT diet, diet and lifestyle modifications advised for gastritis/GERD, discussed use of tylenol, sparing NSAID use, avoid alcohol, use heat to abdomen to help with ovarian cyst pain, and f/up with her OBGYN as well as her PCP for recheck of symptoms and ongoing management in 1 week. Also discussed importance of following up with getting her  colonoscopy.  I explained the diagnosis and have given explicit precautions to return to the ER including for any other new or worsening symptoms. The patient understands and accepts the medical plan as it's been dictated and I have answered their questions. Discharge instructions concerning home care and prescriptions have been given. The patient is STABLE and is discharged to home in good condition.    Final Clinical Impressions(s) / ED Diagnoses   Final diagnoses:  Colicky LLQ abdominal pain  Left ovarian cyst  Nausea vomiting and diarrhea  Other chronic gastritis, presence of bleeding unspecified    New Prescriptions New Prescriptions   DICYCLOMINE (BENTYL) 20 MG TABLET    Take 1 tablet (20  mg total) by mouth 2 (two) times daily.   ONDANSETRON (ZOFRAN ODT) 4 MG DISINTEGRATING TABLET    Take 1 tablet (4 mg total) by mouth every 8 (eight) hours as needed for nausea or vomiting.   RANITIDINE (ZANTAC) 150 MG TABLET    Take 1 tablet (150 mg total) by mouth 2 (two) times daily.     [address] [place], [place] 03/09/17 1640    Doug Sou, MD 03/11/17 [phone]

## 2017-03-09 NOTE — ED Triage Notes (Signed)
Pt here with c/o lower left abdominal pain x 2 days. Pt reports nausea. Pt reports 1 episode of emesis today and symptoms similar to her colitis. Pt states she also has been told she needs to have an ovary removed and she thinks her symptoms may be related to complications with that. Pt reports her pain is 8/10 and it achey and throbbing in nature

## 2017-05-28 ENCOUNTER — Emergency Department (HOSPITAL_COMMUNITY)
Admission: EM | Admit: 2017-05-28 | Discharge: 2017-05-29 | Disposition: A | Discharge status: Home / Self Care | Payer: BLUE CROSS/BLUE SHIELD | Attending: Emergency Medicine | Admitting: Emergency Medicine

## 2017-05-28 DIAGNOSIS — R51 Headache: Secondary | ICD-10-CM | POA: Diagnosis not present

## 2017-05-28 DIAGNOSIS — Z79899 Other long term (current) drug therapy: Secondary | ICD-10-CM | POA: Diagnosis not present

## 2017-05-28 DIAGNOSIS — R1032 Left lower quadrant pain: Secondary | ICD-10-CM | POA: Insufficient documentation

## 2017-05-28 DIAGNOSIS — F17210 Nicotine dependence, cigarettes, uncomplicated: Secondary | ICD-10-CM | POA: Insufficient documentation

## 2017-05-28 DIAGNOSIS — R519 Headache, unspecified: Secondary | ICD-10-CM

## 2017-05-28 LAB — URINALYSIS, ROUTINE W REFLEX MICROSCOPIC
Bacteria, UA: NONE SEEN
Bilirubin Urine: NEGATIVE
Glucose, UA: NEGATIVE mg/dL
Hgb urine dipstick: NEGATIVE
Ketones, ur: NEGATIVE mg/dL
NITRITE: NEGATIVE
PH: 5 (ref 5.0–8.0)
Protein, ur: NEGATIVE mg/dL
SPECIFIC GRAVITY, URINE: 1.017 (ref 1.005–1.030)

## 2017-05-28 LAB — COMPREHENSIVE METABOLIC PANEL
ALK PHOS: 66 U/L (ref 38–126)
ALT: 15 U/L (ref 14–54)
AST: 20 U/L (ref 15–41)
Albumin: 4.2 g/dL (ref 3.5–5.0)
Anion gap: 10 (ref 5–15)
BILIRUBIN TOTAL: 0.9 mg/dL (ref 0.3–1.2)
BUN: 14 mg/dL (ref 6–20)
CO2: 23 mmol/L (ref 22–32)
CREATININE: 0.73 mg/dL (ref 0.44–1.00)
Calcium: 9.3 mg/dL (ref 8.9–10.3)
Chloride: 105 mmol/L (ref 101–111)
GFR calc Af Amer: >60 mL/min (ref >60)
Glucose, Bld: 87 mg/dL (ref 65–99)
Potassium: 3.6 mmol/L (ref 3.5–5.1)
Sodium: 138 mmol/L (ref 135–145)
TOTAL PROTEIN: 7.1 g/dL (ref 6.5–8.1)

## 2017-05-28 LAB — CBC
HCT: 41.2 % (ref 36.0–46.0)
Hemoglobin: 14.2 g/dL (ref 12.0–15.0)
MCH: 33.6 pg (ref 26.0–34.0)
MCHC: 34.5 g/dL (ref 30.0–36.0)
MCV: 97.6 fL (ref 78.0–100.0)
PLATELETS: 220 10*3/uL (ref 150–400)
RBC: 4.22 MIL/uL (ref 3.87–5.11)
RDW: 12.1 % (ref 11.5–15.5)
WBC: 7.7 10*3/uL (ref 4.0–10.5)

## 2017-05-28 LAB — LIPASE, BLOOD: Lipase: 62 U/L — ABNORMAL HIGH (ref 11–51)

## 2017-05-28 MED ORDER — SODIUM CHLORIDE 0.9 % IV BOLUS (SEPSIS)
1000.0000 mL | Freq: Once | INTRAVENOUS | Status: AC
  Administered 2017-05-29: 1000 mL via INTRAVENOUS

## 2017-05-28 MED ORDER — ONDANSETRON HCL 4 MG/2ML IJ SOLN
4.0000 mg | Freq: Once | INTRAMUSCULAR | Status: AC
  Administered 2017-05-28: 4 mg via INTRAVENOUS
  Filled 2017-05-28: qty 2

## 2017-05-28 MED ORDER — HYDROCODONE-ACETAMINOPHEN 5-325 MG PO TABS
1.0000 | ORAL_TABLET | Freq: Once | ORAL | Status: AC
  Administered 2017-05-28: 1 via ORAL
  Filled 2017-05-28: qty 1

## 2017-05-28 MED ORDER — SODIUM CHLORIDE 0.9 % IV BOLUS (SEPSIS)
1000.0000 mL | Freq: Once | INTRAVENOUS | Status: AC
  Administered 2017-05-28: 1000 mL via INTRAVENOUS
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
## 2017-05-28 NOTE — ED Triage Notes (Signed)
Pt states that she has a hx of colitis and has been having LLQ pain x 4 days. N/V. Alert and oriented.

## 2017-05-28 NOTE — ED Notes (Signed)
Pt wanting IV out and to leave AMA. This Clinical research associatewriter explained risk of leaving AMA; pt agrees to continue to wait. Pt reassured we are aware of her wait time and will get here back as soon as a room is available.

## 2017-05-28 NOTE — ED Triage Notes (Signed)
Pt informed that she cannot leave with IV in arm and to notify RN if she needs to leave and it will be removed.

## 2017-05-29 MED ORDER — SUCRALFATE 1 G PO TABS: 1.0000 g | ORAL_TABLET | Freq: Two times a day (BID) | ORAL | 1 refills | Status: DC

## 2017-05-29 MED ORDER — DICYCLOMINE HCL 20 MG PO TABS: 20.0000 mg | ORAL_TABLET | Freq: Two times a day (BID) | ORAL | 1 refills | Status: DC

## 2017-05-29 NOTE — Discharge Instructions (Signed)
You may take Bentyl tablet two times a day with meals. This medicine is for spasm in the stomach.   You may take carafate tablet twice a day with meals. This medicine coats the lining of your esophagus and stomach and should help with indigestion.   Please return if you develop a fever and diarrhea or nausea and vomiting that will not stop.   You can make an appointment with [facility] for follow up on your chronic headaches, stomach pain.

## 2017-07-04 ENCOUNTER — Encounter: Payer: Self-pay | Admitting: Obstetrics and Gynecology

## 2018-01-16 ENCOUNTER — Emergency Department (HOSPITAL_BASED_OUTPATIENT_CLINIC_OR_DEPARTMENT_OTHER): Payer: BLUE CROSS/BLUE SHIELD

## 2018-01-16 ENCOUNTER — Emergency Department (HOSPITAL_BASED_OUTPATIENT_CLINIC_OR_DEPARTMENT_OTHER)
Admission: EM | Admit: 2018-01-16 | Discharge: 2018-01-16 | Disposition: A | Discharge status: Home / Self Care | Payer: BLUE CROSS/BLUE SHIELD | Attending: Physician Assistant | Admitting: Physician Assistant

## 2018-01-16 ENCOUNTER — Encounter (HOSPITAL_BASED_OUTPATIENT_CLINIC_OR_DEPARTMENT_OTHER): Payer: Self-pay

## 2018-01-16 DIAGNOSIS — J0110 Acute frontal sinusitis, unspecified: Secondary | ICD-10-CM | POA: Diagnosis not present

## 2018-01-16 DIAGNOSIS — Z79899 Other long term (current) drug therapy: Secondary | ICD-10-CM | POA: Diagnosis not present

## 2018-01-16 DIAGNOSIS — F17210 Nicotine dependence, cigarettes, uncomplicated: Secondary | ICD-10-CM | POA: Diagnosis not present

## 2018-01-16 DIAGNOSIS — R05 Cough: Secondary | ICD-10-CM | POA: Diagnosis present

## 2018-01-16 MED ORDER — BENZONATATE 100 MG PO CAPS: 100.0000 mg | ORAL_CAPSULE | Freq: Three times a day (TID) | ORAL | 0 refills | Status: DC

## 2018-01-16 MED ORDER — AMOXICILLIN-POT CLAVULANATE 875-125 MG PO TABS: 1.0000 | ORAL_TABLET | Freq: Two times a day (BID) | ORAL | 0 refills | Status: DC

## 2018-01-16 MED ORDER — CETIRIZINE HCL 10 MG PO TABS: 10.0000 mg | ORAL_TABLET | Freq: Every day | ORAL | 0 refills | Status: DC

## 2018-01-16 MED FILL — CETIRIZINE HCL 10 MG TABLET: 10 | 90 days supply | Qty: 100 | Fill #0

## 2018-01-16 MED FILL — BENZONATATE 100 MG CAPSULE: 100 | 7 days supply | Qty: 21 | Fill #0

## 2018-01-16 MED FILL — AMOX-CLAV 875-125 MG TABLET: 875-125 | 7 days supply | Qty: 14 | Fill #0

## 2018-01-16 NOTE — ED Triage Notes (Signed)
Pt c/o URI x2wks, completed a z-pak 5days ago, c/o nonproductive cough with chest congestion and sinus pressure

## 2018-01-16 NOTE — ED Provider Notes (Signed)
[facility] EMERGENCY DEPARTMENT Provider Note   CSN: [id number] Arrival date & time: 01/16/18  1324     History   Chief Complaint Chief Complaint  Patient presents with  . Cough    HPI Allison Le is a 57 y.o. female with history of depression, migraine who presents with a 2-1/2-week history of congestion, cough, intermittent shortness of breath.  Patient reports she finished a Z-Pak called in by her doctor 4-5 days ago.  She is also taking Mucinex at home without relief.  She has had associated significant facial pain and headache.  She denies any fevers at home.  She has had some shortness of breath with exertion, however mild.  She denies chest pain, abdominal pain, nausea, vomiting.  Patient reports she has noticed some swollen glands in her neck.  HPI  Past Medical History:  Diagnosis Date  . Colitis   . Depression   . Migraine     There are no active problems to display for this patient.   Past Surgical History:  Procedure Laterality Date  . ABDOMINAL HYSTERECTOMY    . HIP SURGERY       OB History   None      Home Medications    Prior to Admission medications   Medication Sig Start Date End Date Taking? Authorizing Provider  amoxicillin-clavulanate (AUGMENTIN) 875-125 MG tablet Take 1 tablet by mouth every 12 (twelve) hours. 01/16/18   Haja Crego, Waylan Boga, PA-C  Ascorbic Acid (VITAMIN C PO) Take 2 tablets by mouth daily.    [provider]  Aspirin-Acetaminophen-Caffeine (GOODY HEADACHE PO) Take 1 packet by mouth as needed (pain,headache).    [provider]  benzonatate (TESSALON) 100 MG capsule Take 1 capsule (100 mg total) by mouth every 8 (eight) hours. 01/16/18   Wren Gallaga, Waylan Boga, PA-C  buPROPion (WELLBUTRIN XL) 300 MG 24 hr tablet Take 300 mg by mouth daily. 11/13/16   [provider]  cetirizine (ZYRTEC ALLERGY) 10 MG tablet Take 1 tablet (10 mg total) by mouth daily. 01/16/18   Adis Sturgill, Waylan Boga, PA-C  clonazePAM  (KLONOPIN) 0.5 MG tablet Take 0.5 mg by mouth 2 (two) times daily as needed for anxiety. 12/09/16   [provider]  diclofenac sodium (VOLTAREN) 1 % GEL Apply 4 g topically 4 (four) times daily. Patient not taking: Reported on 03/09/2017 11/06/16   Renne Crigler, PA-C  dicyclomine (BENTYL) 20 MG tablet Take 1 tablet (20 mg total) by mouth 2 (two) times daily. 05/29/17   Kellie Shropshire, PA-C  FLUoxetine (PROZAC) 40 MG capsule Take 40 mg by mouth daily. 11/13/16   [provider]  HYDROcodone-acetaminophen (NORCO) 5-325 MG tablet Take 1 tablet by mouth every 6 (six) hours as needed for moderate pain. Patient not taking: Reported on 05/28/2017 01/28/17   Jaynie Crumble, PA-C  ibuprofen (ADVIL,MOTRIN) 200 MG tablet Take 400-600 mg by mouth every 6 (six) hours as needed.    [provider]  ondansetron (ZOFRAN ODT) 4 MG disintegrating tablet Take 1 tablet (4 mg total) by mouth every 8 (eight) hours as needed for nausea or vomiting. 03/09/17   Street, [place], PA-C  ondansetron (ZOFRAN ODT) 8 MG disintegrating tablet Take 1 tablet (8 mg total) by mouth every 8 (eight) hours as needed for nausea or vomiting. Patient not taking: Reported on 03/09/2017 01/28/17   Jaynie Crumble, PA-C  ranitidine (ZANTAC) 150 MG tablet Take 1 tablet (150 mg total) by mouth 2 (two) times daily. 03/09/17 04/08/17  Street,  [place], PA-C  sucralfate (CARAFATE) 1 g tablet Take 1 tablet (1 g total) by mouth 2 (two) times daily. 05/29/17   Kellie Shropshire, PA-C  Zinc 100 MG TABS Take 100 mg by mouth daily.    [provider]    Family History No family history on file.  Social History Social History   Tobacco Use  . Smoking status: Current Every Day Smoker    Types: Cigarettes  . Smokeless tobacco: Never Used  Substance Use Topics  . Alcohol use: Yes    Comment: occ  . Drug use: No     Allergies   Morphine and related; Nitrofurantoin; and Tramadol   Review of  Systems Review of Systems  Constitutional: Negative for chills and fever.  HENT: Positive for congestion, sinus pressure and sinus pain. Negative for ear pain, facial swelling and sore throat.   Respiratory: Positive for cough and shortness of breath.   Cardiovascular: Negative for chest pain.  Gastrointestinal: Negative for abdominal pain, nausea and vomiting.  Skin: Negative for rash and wound.  Neurological: Positive for headaches.  Psychiatric/Behavioral: The patient is not nervous/anxious.      Physical Exam Updated Vital Signs BP 135/88 (BP Location: Right Arm)   Pulse 71   Temp 98.8 F (37.1 C) (Oral)   Resp 16   Ht 5\' 2"  (1.575 m)   Wt 49.9 kg (110 lb)   SpO2 99%   BMI 20.12 kg/m   Physical Exam  Constitutional: She appears well-developed and well-nourished. No distress.  HENT:  Head: Normocephalic and atraumatic.    Nose: Right sinus exhibits frontal sinus tenderness. Left sinus exhibits frontal sinus tenderness.  Mouth/Throat: Oropharynx is clear and moist. No oropharyngeal exudate.  Right TM clear, left ear canal obstructed with cerumen  Eyes: Pupils are equal, round, and reactive to light. Conjunctivae are normal. Right eye exhibits no discharge. Left eye exhibits no discharge. No scleral icterus.  Neck: Normal range of motion. Neck supple. No thyromegaly present.  Cardiovascular: Normal rate, regular rhythm, normal heart sounds and intact distal pulses. Exam reveals no gallop and no friction rub.  No murmur heard. Pulmonary/Chest: Effort normal. No stridor. No respiratory distress. She has decreased breath sounds. She has no wheezes. She has no rales.  Abdominal: Soft. Bowel sounds are normal. She exhibits no distension. There is no tenderness. There is no rebound and no guarding.  Musculoskeletal: She exhibits no edema.  Lymphadenopathy:    She has no cervical adenopathy.  Neurological: She is alert. Coordination normal.  Skin: Skin is warm and dry. No rash  noted. She is not diaphoretic. No pallor.  Psychiatric: She has a normal mood and affect.  Nursing note and vitals reviewed.    ED Treatments / Results  Labs (all labs ordered are listed, but only abnormal results are displayed) Labs Reviewed - No data to display  EKG None  Radiology Dg Chest 2 View  Result Date: 01/16/2018 CLINICAL DATA:  Cough and congestion with shortness of breath EXAM: CHEST - 2 VIEW COMPARISON:  None. FINDINGS: Lungs are clear. Heart size and pulmonary vascularity are normal. No adenopathy. No pneumothorax. No bone lesions. IMPRESSION: No edema or consolidation. Electronically Signed   By: Bretta Bang III M.D.   On: 01/16/2018 15:59    Procedures Procedures (including critical care time)  Medications Ordered in ED Medications - No data to display   Initial Impression / Assessment and Plan / ED Course  I have reviewed the triage vital signs  and the nursing notes.  Pertinent labs & imaging results that were available during my care of the patient were reviewed by me and considered in my medical decision making (see chart for details).     Patient with suspected acute sinusitis resistant to azithromycin.  Chest x-ray is negative.  Patient with facial tenderness and pain.  Will treat with Augmentin as well as Zyrtec and Tessalon for cough.  Patient also advised to use Flonase as she has been at home.  Patient reports that she has gotten B12 shots in the past and has not had them recently.  This may explain patient's fatigue as well as her sinusitis.  Patient to follow-up with PCP for recheck and further evaluation.  Return precautions discussed.  Patient understands and agrees with plan.  Patient vitals stable throughout ED course and discharged in satisfactory condition.  Final Clinical Impressions(s) / ED Diagnoses   Final diagnoses:  Acute frontal sinusitis, recurrence not specified    ED Discharge Orders        Ordered     amoxicillin-clavulanate (AUGMENTIN) 875-125 MG tablet  Every 12 hours     01/16/18 1620    cetirizine (ZYRTEC ALLERGY) 10 MG tablet  Daily     01/16/18 1620    benzonatate (TESSALON) 100 MG capsule  Every 8 hours     01/16/18 1620       Emi HolesLaw, Corinthian Mizrahi M, PA-C 01/16/18 1639    Abelino DerrickMackuen, Courteney Lyn, MD 01/17/18 2348

## 2018-01-16 NOTE — Discharge Instructions (Signed)
Medications: Augmentin, Tessalon, Zyrtec  Treatment: Take Augmentin twice daily until completed.  Take Tessalon every 8 hours as needed for cough.  Take Zyrtec once daily to help with nasal congestion.  Continue taking Flonase as prescribed.  Follow-up: Please follow-up with your primary care provider if your symptoms are not improving after completion of the Augmentin.  Please return to the emergency department if you develop any new or worsening symptoms.

## 2018-05-20 DIAGNOSIS — M722 Plantar fascial fibromatosis: Secondary | ICD-10-CM

## 2018-05-20 DIAGNOSIS — E538 Deficiency of other specified B group vitamins: Secondary | ICD-10-CM

## 2018-05-20 DIAGNOSIS — G43009 Migraine without aura, not intractable, without status migrainosus: Secondary | ICD-10-CM

## 2018-05-20 HISTORY — DX: Plantar fascial fibromatosis: M72.2

## 2018-05-20 HISTORY — DX: Migraine without aura, not intractable, without status migrainosus: G43.009

## 2018-05-20 HISTORY — DX: Deficiency of other specified B group vitamins: E53.8

## 2018-08-25 IMAGING — CR DG KNEE COMPLETE 4+V*R*
4 series · 4 of 4 positions shown · non-contrast
Comparison: None.

CLINICAL DATA: Right knee pain after fall today.

EXAM:
RIGHT KNEE - COMPLETE 4+ VIEW

[t knee ap right]
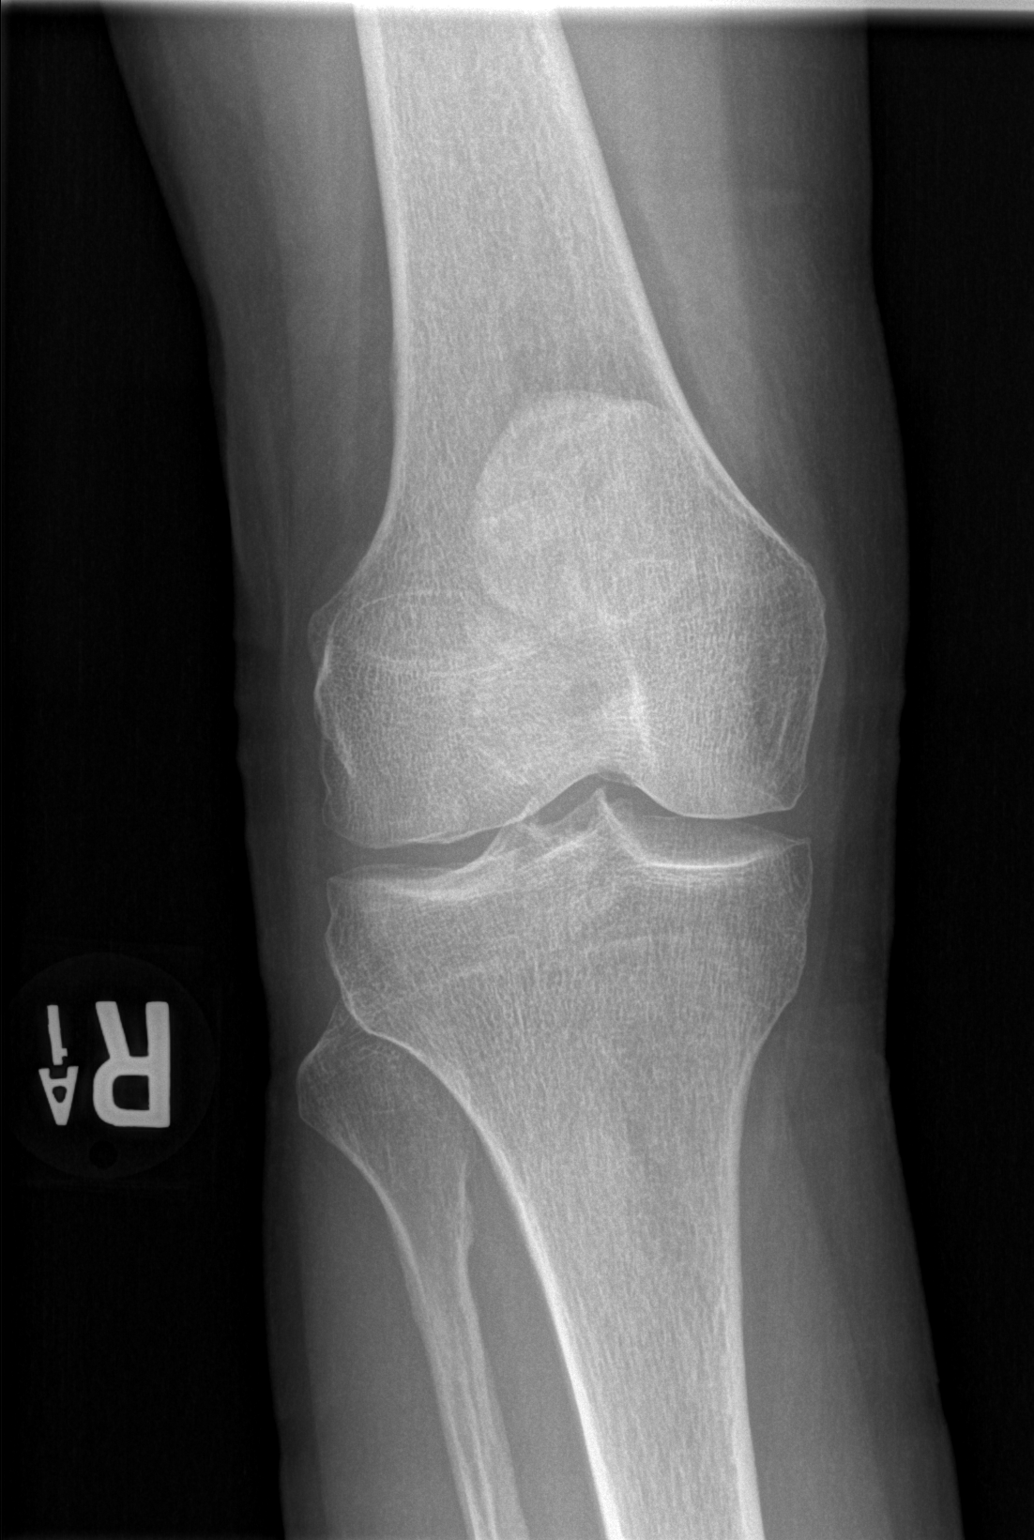

[t knee oblique right (1 of 2)]
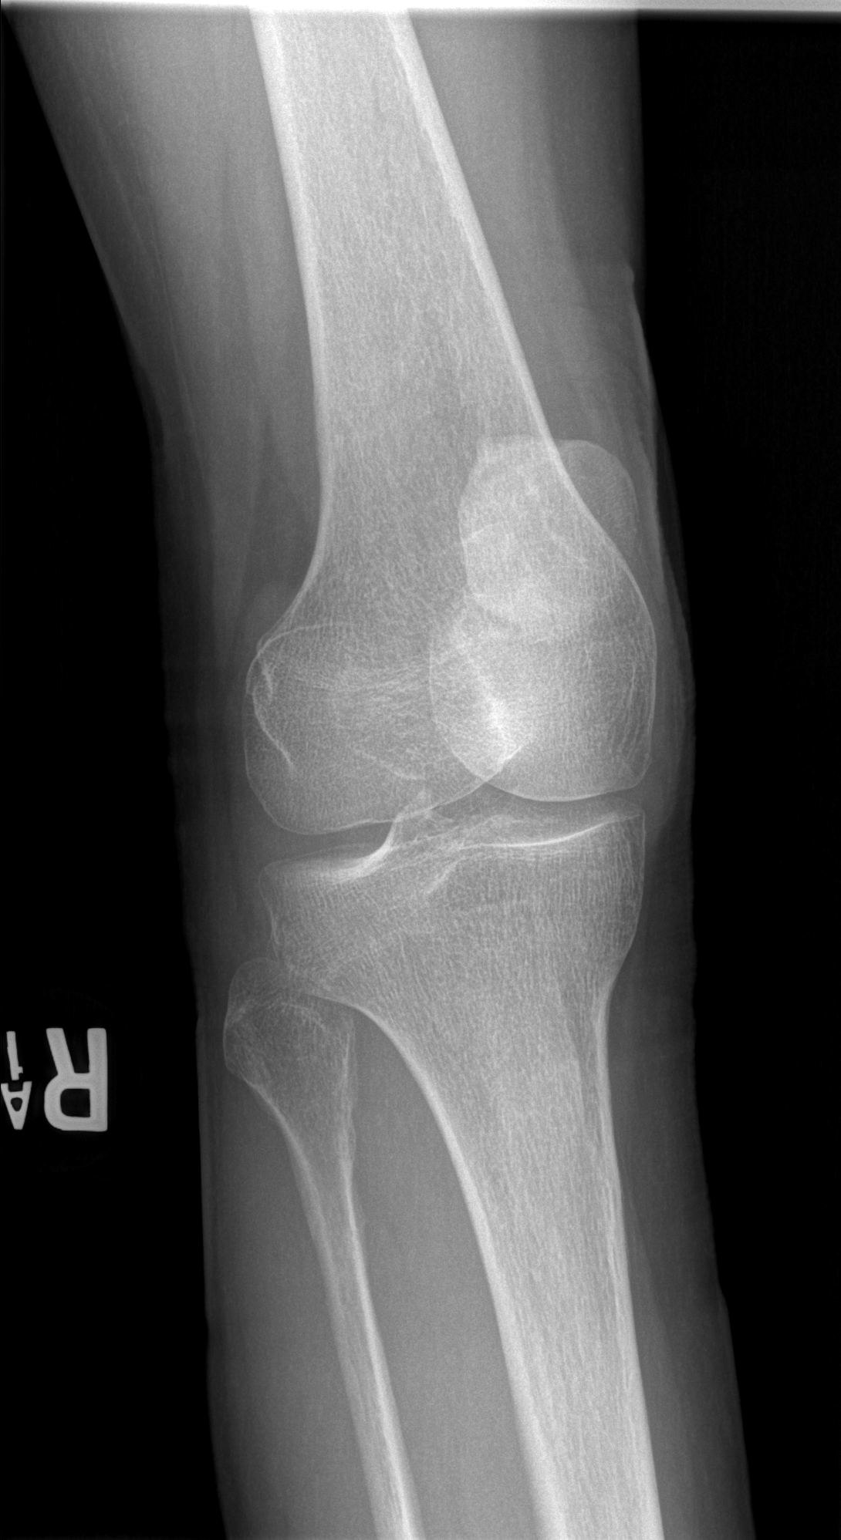

[t knee oblique right (2 of 2)]
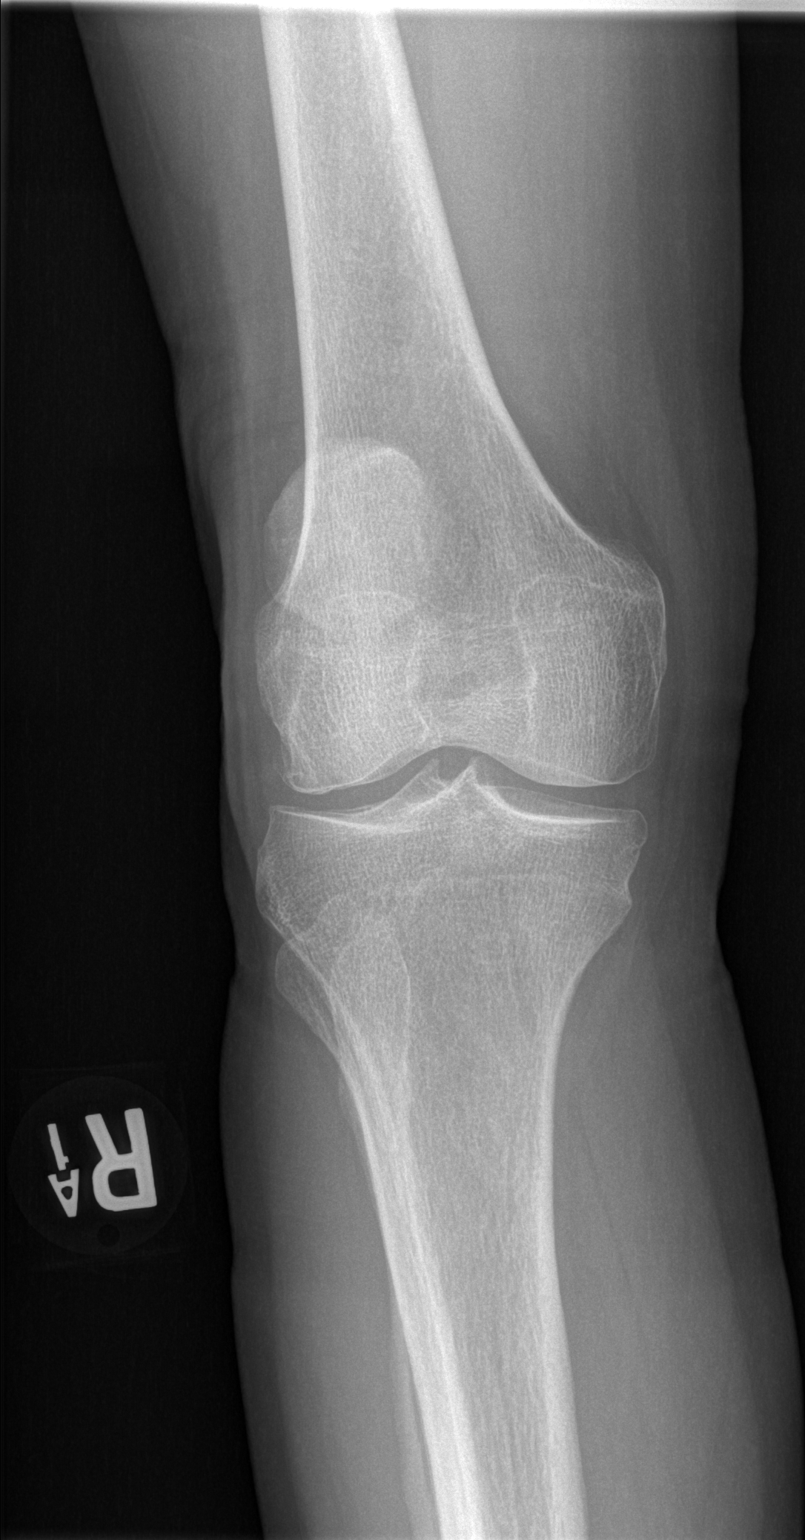

[t knee lat right]
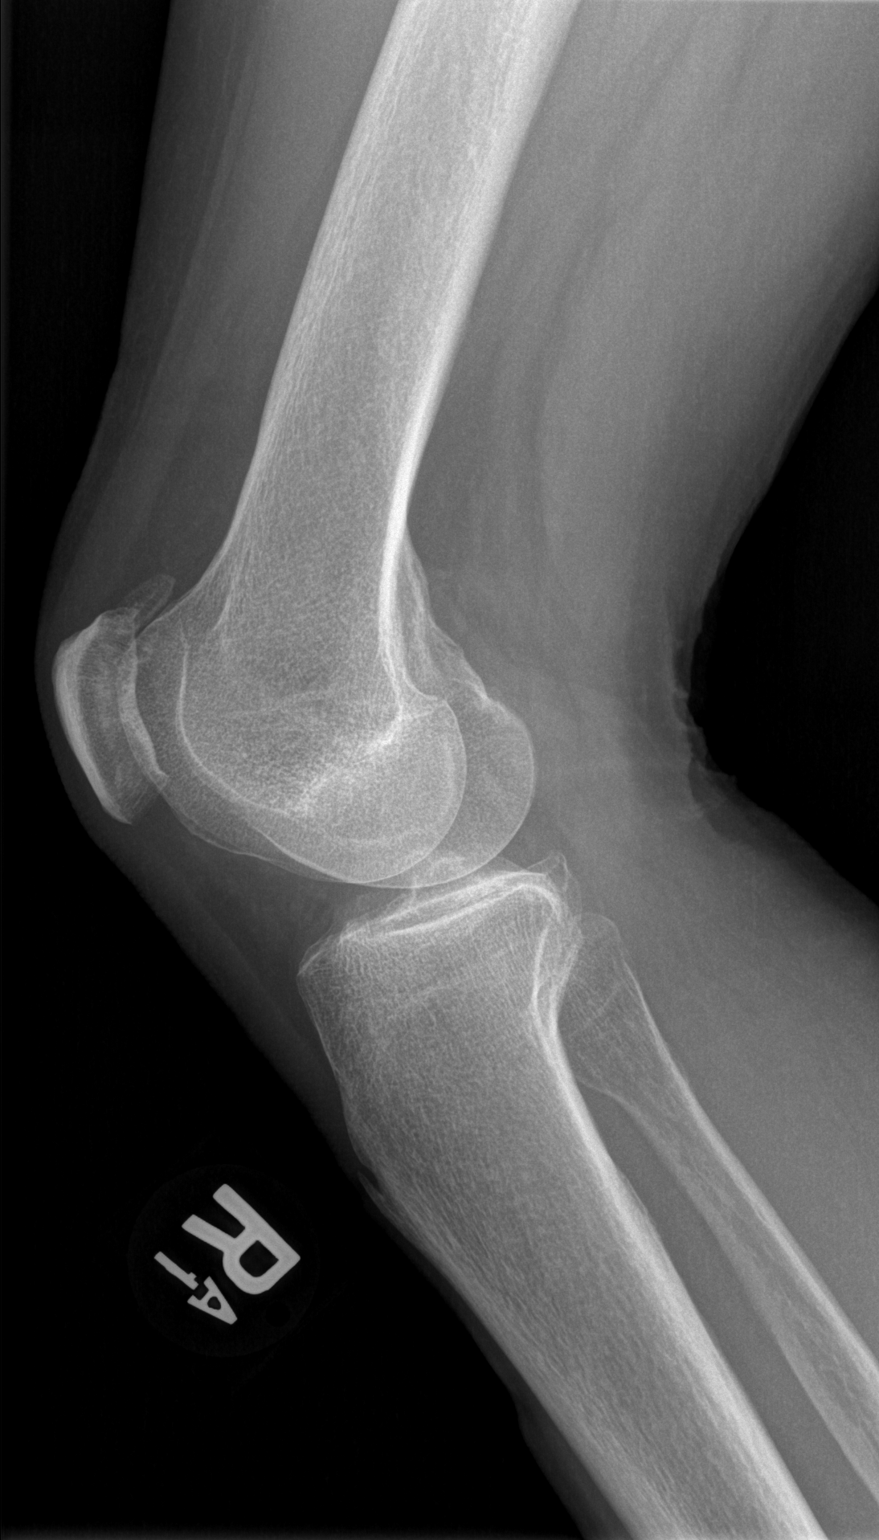

[4 of 4 positions shown; findings below may reference images not displayed]

FINDINGS: No evidence of fracture, dislocation, or joint effusion. No evidence
of arthropathy or other focal bone abnormality. Soft tissues are
unremarkable.
IMPRESSION: Normal right knee.

## 2018-12-07 MED FILL — HYDROCODON-APAP 5-325: 5-325 | 7 days supply | Qty: 15 | Fill #0

## 2018-12-07 MED FILL — FLUoxetine HCL 40 MG CAPS: 40 | 30 days supply | Qty: 30 | Fill #0

## 2018-12-07 MED FILL — buPROPion HCL ER (XL) 300 M: 300 | 30 days supply | Qty: 30 | Fill #0

## 2019-01-04 MED FILL — FLUoxetine HCL 40 MG CAPS: 40 | 30 days supply | Qty: 30 | Fill #0

## 2019-01-04 MED FILL — buPROPion HCL ER (XL) 300 M: 300 | 30 days supply | Qty: 30 | Fill #0

## 2019-02-01 MED FILL — FLUoxetine HCL 40 MG CAPS: 40 | 30 days supply | Qty: 30 | Fill #0

## 2019-02-01 MED FILL — buPROPion HCL ER (XL) 300 M: 300 | 30 days supply | Qty: 30 | Fill #0

## 2019-02-08 ENCOUNTER — Encounter (HOSPITAL_BASED_OUTPATIENT_CLINIC_OR_DEPARTMENT_OTHER): Payer: Self-pay | Admitting: *Deleted

## 2019-02-08 ENCOUNTER — Emergency Department (HOSPITAL_BASED_OUTPATIENT_CLINIC_OR_DEPARTMENT_OTHER)
Admission: EM | Admit: 2019-02-08 | Discharge: 2019-02-08 | Disposition: A | Discharge status: Home / Self Care | Payer: Self-pay | Attending: Emergency Medicine | Admitting: Emergency Medicine

## 2019-02-08 ENCOUNTER — Other Ambulatory Visit: Payer: Self-pay

## 2019-02-08 ENCOUNTER — Emergency Department (HOSPITAL_BASED_OUTPATIENT_CLINIC_OR_DEPARTMENT_OTHER): Payer: Self-pay

## 2019-02-08 DIAGNOSIS — Z1159 Encounter for screening for other viral diseases: Secondary | ICD-10-CM | POA: Insufficient documentation

## 2019-02-08 DIAGNOSIS — R1030 Lower abdominal pain, unspecified: Secondary | ICD-10-CM

## 2019-02-08 DIAGNOSIS — Z79899 Other long term (current) drug therapy: Secondary | ICD-10-CM | POA: Insufficient documentation

## 2019-02-08 DIAGNOSIS — F17210 Nicotine dependence, cigarettes, uncomplicated: Secondary | ICD-10-CM | POA: Insufficient documentation

## 2019-02-08 DIAGNOSIS — K5900 Constipation, unspecified: Secondary | ICD-10-CM | POA: Insufficient documentation

## 2019-02-08 LAB — COMPREHENSIVE METABOLIC PANEL
ALT: 23 U/L (ref 0–44)
AST: 22 U/L (ref 15–41)
Albumin: 4 g/dL (ref 3.5–5.0)
Alkaline Phosphatase: 66 U/L (ref 38–126)
Anion gap: 9 (ref 5–15)
BUN: 18 mg/dL (ref 6–20)
CO2: 26 mmol/L (ref 22–32)
Calcium: 9.1 mg/dL (ref 8.9–10.3)
Chloride: 105 mmol/L (ref 98–111)
Creatinine, Ser: 0.82 mg/dL (ref 0.44–1.00)
GFR calc Af Amer: >60 mL/min (ref >60)
GFR calc non Af Amer: >60 mL/min (ref >60)
Glucose, Bld: 99 mg/dL (ref 70–99)
Potassium: 4 mmol/L (ref 3.5–5.1)
Sodium: 140 mmol/L (ref 135–145)
Total Bilirubin: 0.5 mg/dL (ref 0.3–1.2)
Total Protein: 6.4 g/dL — ABNORMAL LOW (ref 6.5–8.1)

## 2019-02-08 LAB — CBC WITH DIFFERENTIAL/PLATELET
Abs Immature Granulocytes: 0.02 10*3/uL (ref 0.00–0.07)
Basophils Absolute: 0 10*3/uL (ref 0.0–0.1)
Basophils Relative: 1 %
Eosinophils Absolute: 0.4 10*3/uL (ref 0.0–0.5)
Eosinophils Relative: 7 %
HCT: 41 % (ref 36.0–46.0)
Hemoglobin: 13.7 g/dL (ref 12.0–15.0)
Immature Granulocytes: 0 %
Lymphocytes Relative: 25 %
Lymphs Abs: 1.6 10*3/uL (ref 0.7–4.0)
MCH: 33.7 pg (ref 26.0–34.0)
MCHC: 33.4 g/dL (ref 30.0–36.0)
MCV: 100.7 fL — ABNORMAL HIGH (ref 80.0–100.0)
Monocytes Absolute: 0.5 10*3/uL (ref 0.1–1.0)
Monocytes Relative: 8 %
Neutro Abs: 3.8 10*3/uL (ref 1.7–7.7)
Neutrophils Relative %: 59 %
Platelets: 207 10*3/uL (ref 150–400)
RBC: 4.07 MIL/uL (ref 3.87–5.11)
RDW: 12 % (ref 11.5–15.5)
WBC: 6.4 10*3/uL (ref 4.0–10.5)
nRBC: 0 % (ref 0.0–0.2)

## 2019-02-08 LAB — URINALYSIS, ROUTINE W REFLEX MICROSCOPIC
Bilirubin Urine: NEGATIVE
Glucose, UA: NEGATIVE mg/dL
Hgb urine dipstick: NEGATIVE
Ketones, ur: NEGATIVE mg/dL
Leukocytes,Ua: NEGATIVE
Nitrite: NEGATIVE
Protein, ur: NEGATIVE mg/dL
Specific Gravity, Urine: >1.03 — ABNORMAL HIGH (ref 1.005–1.030)
pH: 6 (ref 5.0–8.0)

## 2019-02-08 LAB — SARS CORONAVIRUS 2 AG (30 MIN TAT): SARS Coronavirus 2 Ag: NEGATIVE

## 2019-02-08 LAB — LIPASE, BLOOD: Lipase: 29 U/L (ref 11–51)

## 2019-02-08 MED ORDER — SODIUM CHLORIDE 0.9 % IV BOLUS
1000.0000 mL | Freq: Once | INTRAVENOUS | Status: AC
  Administered 2019-02-08: 11:00:00 1000 mL via INTRAVENOUS

## 2019-02-08 MED ORDER — IOHEXOL 300 MG/ML  SOLN
100.0000 mL | Freq: Once | INTRAMUSCULAR | Status: AC | PRN
  Administered 2019-02-08: 100 mL via INTRAVENOUS

## 2019-02-08 MED ORDER — MAGNESIUM CITRATE PO SOLN
1.0000 | Freq: Once | ORAL | Status: AC
  Administered 2019-02-08: 13:00:00 1 via ORAL
  Filled 2019-02-08: qty 296

## 2019-02-08 NOTE — ED Triage Notes (Signed)
C/o abd pain w n/v  X 10 days  Last vomited 6 days ago  But has had constipation  Took ptc meds  Had bm,  Last bm now has been 4 days

## 2019-02-08 NOTE — ED Notes (Signed)
Patient transported to CT 

## 2019-02-08 NOTE — Discharge Instructions (Addendum)
You coronavirus test was negative. Drink plenty of water and continue miralax. Thank you for allowing me to care for you today. Please return to the emergency department if you have new or worsening symptoms.

## 2019-02-08 NOTE — ED Provider Notes (Signed)
[facility] EMERGENCY DEPARTMENT Provider Note   CSN: [id number] Arrival date & time: 02/08/19  1010    History   Chief Complaint Chief Complaint  Patient presents with  . Abdominal Pain    HPI Allison Le is a 58 y.o. female.     Patient is a 58 year old female past medical history of colitis, depression, migraines who presents the emergency department for abdominal pain.  Patient reports that this all started about a week and a half ago.  Reports that she thought she might of had a stomach bug initially.  Reports that she had a lot of cramping and pain in the lower part of her abdomen and was unable to have a bowel movement for about 4 days.  Reports that she took some over-the-counter milk of magnesia which helped her move her bowels once.  Reports that she then had trouble moving her bowels again and had a lot of fatigue and bilateral lower quadrant abdominal pain.  Denies any fevers but did feel chills.  She endorses having vomiting earlier in the week but not since Wednesday.  Denies any vaginal complaints.  Reports that she felt like she might be dehydrated and that her colitis might be flaring up so she wanted to get checked out first before going back to work.     Past Medical History:  Diagnosis Date  . Colitis   . Depression   . Migraine     There are no active problems to display for this patient.   Past Surgical History:  Procedure Laterality Date  . ABDOMINAL HYSTERECTOMY    . HIP SURGERY       OB History   No obstetric history on file.      Home Medications    Prior to Admission medications   Medication Sig Start Date End Date Taking? Authorizing Provider  amoxicillin-clavulanate (AUGMENTIN) 875-125 MG tablet Take 1 tablet by mouth every 12 (twelve) hours. 01/16/18   Law, Waylan Boga, PA-C  Ascorbic Acid (VITAMIN C PO) Take 2 tablets by mouth daily.    [provider]  Aspirin-Acetaminophen-Caffeine (GOODY HEADACHE PO) Take 1  packet by mouth as needed (pain,headache).    [provider]  benzonatate (TESSALON) 100 MG capsule Take 1 capsule (100 mg total) by mouth every 8 (eight) hours. 01/16/18   Law, Waylan Boga, PA-C  buPROPion (WELLBUTRIN XL) 300 MG 24 hr tablet Take 300 mg by mouth daily. 11/13/16   [provider]  cetirizine (ZYRTEC ALLERGY) 10 MG tablet Take 1 tablet (10 mg total) by mouth daily. 01/16/18   Law, Waylan Boga, PA-C  clonazePAM (KLONOPIN) 0.5 MG tablet Take 0.5 mg by mouth 2 (two) times daily as needed for anxiety. 12/09/16   [provider]  diclofenac sodium (VOLTAREN) 1 % GEL Apply 4 g topically 4 (four) times daily. Patient not taking: Reported on 03/09/2017 11/06/16   Renne Crigler, PA-C  dicyclomine (BENTYL) 20 MG tablet Take 1 tablet (20 mg total) by mouth 2 (two) times daily. 05/29/17   Kellie Shropshire, PA-C  FLUoxetine (PROZAC) 40 MG capsule Take 40 mg by mouth daily. 11/13/16   [provider]  HYDROcodone-acetaminophen (NORCO) 5-325 MG tablet Take 1 tablet by mouth every 6 (six) hours as needed for moderate pain. Patient not taking: Reported on 05/28/2017 01/28/17   Jaynie Crumble, PA-C  ibuprofen (ADVIL,MOTRIN) 200 MG tablet Take 400-600 mg by mouth every 6 (six) hours as needed.    [provider]  ondansetron (  ZOFRAN ODT) 4 MG disintegrating tablet Take 1 tablet (4 mg total) by mouth every 8 (eight) hours as needed for nausea or vomiting. 03/09/17   Street, Mercedes, PA-C  ondansetron (ZOFRAN ODT) 8 MG disintegrating tablet Take 1 tablet (8 mg total) by mouth every 8 (eight) hours as needed for nausea or vomiting. Patient not taking: Reported on 03/09/2017 01/28/17   Jaynie Crumble, PA-C  ranitidine (ZANTAC) 150 MG tablet Take 1 tablet (150 mg total) by mouth 2 (two) times daily. 03/09/17 04/08/17  Street, Mercedes, PA-C  sucralfate (CARAFATE) 1 g tablet Take 1 tablet (1 g total) by mouth 2 (two) times daily. 05/29/17   Kellie Shropshire, PA-C   Zinc 100 MG TABS Take 100 mg by mouth daily.    [provider]    Family History No family history on file.  Social History Social History   Tobacco Use  . Smoking status: Current Every Day Smoker    Types: Cigarettes  . Smokeless tobacco: Never Used  Substance Use Topics  . Alcohol use: Yes    Comment: occ  . Drug use: No     Allergies   Morphine and related; Nitrofurantoin; and Tramadol   Review of Systems Review of Systems  Constitutional: Positive for appetite change and chills. Negative for fever.  HENT: Negative for ear pain and sore throat.   Eyes: Negative for pain and visual disturbance.  Respiratory: Negative for cough and shortness of breath.   Cardiovascular: Negative for chest pain and palpitations.  Gastrointestinal: Positive for abdominal pain, constipation, nausea and vomiting. Negative for diarrhea.  Genitourinary: Negative for dysuria, hematuria, pelvic pain and vaginal bleeding.  Musculoskeletal: Negative for back pain and myalgias.  Skin: Negative for color change and rash.  Neurological: Negative for syncope.  All other systems reviewed and are negative.    Physical Exam Updated Vital Signs BP (!) 115/96 (BP Location: Right Arm)   Pulse 84   Temp 97.9 F (36.6 C) (Oral)   Resp 18   Ht  (1.575 m)   Wt 49 kg   SpO2 95%   BMI 19.75 kg/m   Physical Exam Vitals signs and nursing note reviewed.  Constitutional:      General: She is not in acute distress.    Appearance: She is well-developed.  HENT:     Head: Normocephalic and atraumatic.  Eyes:     Conjunctiva/sclera: Conjunctivae normal.  Neck:     Musculoskeletal: Neck supple.  Cardiovascular:     Rate and Rhythm: Normal rate and regular rhythm.     Heart sounds: No murmur.  Pulmonary:     Effort: Pulmonary effort is normal. No respiratory distress.     Breath sounds: Normal breath sounds.  Abdominal:     Palpations: Abdomen is soft.     Tenderness: There is  abdominal tenderness in the right lower quadrant and left lower quadrant. There is no right CVA tenderness or left CVA tenderness.     Hernia: No hernia is present.  Skin:    General: Skin is warm and dry.  Neurological:     Mental Status: She is alert.  Psychiatric:        Mood and Affect: Mood normal.      ED Treatments / Results  Labs (all labs ordered are listed, but only abnormal results are displayed) Labs Reviewed  URINALYSIS, ROUTINE W REFLEX MICROSCOPIC - Abnormal; Notable for the following components:      Result Value   Specific  Gravity, Urine >1.030 (*)    All other components within normal limits  CBC WITH DIFFERENTIAL/PLATELET - Abnormal; Notable for the following components:   MCV 100.7 (*)    All other components within normal limits  COMPREHENSIVE METABOLIC PANEL - Abnormal; Notable for the following components:   Total Protein 6.4 (*)    All other components within normal limits  SARS CORONAVIRUS 2 (HOSP ORDER, PERFORMED IN [place] [facility] VIA ABBOTT ID)  LIPASE, BLOOD    EKG None  Radiology Ct Abdomen Pelvis W Contrast  Result Date: 02/08/2019 CLINICAL DATA:  Abdominal pain with nausea and vomiting. EXAM: CT ABDOMEN AND PELVIS WITH CONTRAST TECHNIQUE: Multidetector CT imaging of the abdomen and pelvis was performed using the standard protocol following bolus administration of intravenous contrast. CONTRAST:  100mL OMNIPAQUE IOHEXOL 300 MG/ML  SOLN COMPARISON:  CT scan dated 01/28/2017 FINDINGS: Lower chest: Small chronic hiatal hernia. Slight scarring at the lung bases, stable. Heart size is normal. No pericardial effusion. Hepatobiliary: Diffuse mild hepatic steatosis. Focal fatty sparing adjacent to the gallbladder, slightly more apparent than on the prior study. The gallbladder is not distended. No dilated intrahepatic bile ducts. Chronic prominence of the common bile duct now with a diameter of 10 mm, slightly increased from prior study. No visible stones in  the common bile duct. Pancreas: Unremarkable. No pancreatic ductal dilatation or surrounding inflammatory changes. Spleen: Normal in size without focal abnormality. Adrenals/Urinary Tract: Adrenal glands are unremarkable. Kidneys are normal, without renal calculi, focal lesion, or hydronephrosis. Bladder is unremarkable. Stomach/Bowel: Stomach is within normal limits. Appendix appears normal. No evidence of bowel wall thickening, distention, or inflammatory changes. The colon is redundant with extensive stool throughout the colon but without fecal impaction. Vascular/Lymphatic: No significant vascular findings are present. No enlarged abdominal or pelvic lymph nodes. Reproductive: Status post hysterectomy. No adnexal masses. Other: No abdominal wall hernia or abnormality. No abdominopelvic ascites. Musculoskeletal: No acute or significant osseous findings. IMPRESSION: 1. No acute abnormality of the abdomen or pelvis. 2. Chronic but increased dilatation of the common bile duct without evidence of a stone or mass and without intra hepatic biliary ductal dilatation and without dilatation of the gallbladder. This is nonspecific. Is the patient's bilirubin elevated? 3. Extensive stool throughout the nondistended colon without fecal impaction. Electronically Signed   By: Francene BoyersJames  Maxwell M.D.   On: 02/08/2019 11:46    Procedures Procedures (including critical care time)  Medications Ordered in ED Medications  sodium chloride 0.9 % bolus 1,000 mL (0 mLs Intravenous Stopped 02/08/19 1308)  iohexol (OMNIPAQUE) 300 MG/ML solution 100 mL (100 mLs Intravenous Contrast Given 02/08/19 1124)  magnesium citrate solution 1 Bottle (1 Bottle Oral Given 02/08/19 1255)     Initial Impression / Assessment and Plan / ED Course  I have reviewed the triage vital signs and the nursing notes.  Pertinent labs & imaging results that were available during my care of the patient were reviewed by me and considered in my medical  decision making (see chart for details).  Clinical Course as of Feb 08 1715  Mon Feb 08, 2019  1210 Patient workup showing constiaption on Ct scan but no other acute intra-abdominal process.  Her CMP is unremarkable as well as her CBC.  Her vitals remained normal.  It does appear based on her urine that she was mildly dehydrated.  She was given fluids.  I think that her current symptoms are related to her dehydration and her constipation and this was discussed with  her.  She reports that she is concerned about COVID.  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  We will offer her a COVID test today.   [KM]  1258 COVID test negative.  Patient given bottle of mag citrate and would like to finish this at home.  Advised to continue to stay hydrated and take her MiraLAX.  Follow-up with a primary care doctor or return with new or worse symptoms   [KM]    Clinical Course User Index [KM] Arlyn Dunning, PA-C       Based on review of vitals, medical screening exam, lab work and/or imaging, there does not appear to be an acute, emergent etiology for the patient's symptoms. Counseled pt on good return precautions and encouraged both PCP and ED follow-up as needed.  Prior to discharge, I also discussed incidental imaging findings with patient in detail and advised appropriate, recommended follow-up in detail.  Clinical Impression: 1. Constipation, unspecified constipation type   2. Lower abdominal pain     Disposition: Discharge  Prior to providing a prescription for a controlled substance, I independently reviewed the patient's recent prescription history on the [place] Controlled Substance Reporting System. The patient had no recent or regular prescriptions and was deemed appropriate for a brief, less than 3 day prescription of narcotic for acute analgesia.  This note was prepared with assistance of Conservation officer, historic buildings.  Occasional wrong-word or sound-a-like substitutions may have occurred due to the inherent limitations of voice recognition software.   Final Clinical Impressions(s) / ED Diagnoses   Final diagnoses:  Constipation, unspecified constipation type  Lower abdominal pain    ED Discharge Orders    None       Jeral Pinch 02/08/19 1716    Gwyneth Sprout, MD 02/09/19 2153

## 2019-02-15 ENCOUNTER — Ambulatory Visit: Payer: Self-pay | Admitting: Internal Medicine

## 2019-02-15 ENCOUNTER — Encounter: Payer: Self-pay | Admitting: Internal Medicine

## 2019-02-15 ENCOUNTER — Other Ambulatory Visit: Payer: Self-pay

## 2019-02-15 VITALS — BP 122/88 | HR 78 | Temp 98.0°F | Resp 12 | Ht 61.0 in | Wt 113.5 lb

## 2019-02-15 DIAGNOSIS — G4700 Insomnia, unspecified: Secondary | ICD-10-CM | POA: Diagnosis not present

## 2019-02-15 DIAGNOSIS — F32A Depression, unspecified: Secondary | ICD-10-CM

## 2019-02-15 DIAGNOSIS — F419 Anxiety disorder, unspecified: Secondary | ICD-10-CM | POA: Diagnosis not present

## 2019-02-15 DIAGNOSIS — J449 Chronic obstructive pulmonary disease, unspecified: Secondary | ICD-10-CM

## 2019-02-15 DIAGNOSIS — G43809 Other migraine, not intractable, without status migrainosus: Secondary | ICD-10-CM

## 2019-02-15 DIAGNOSIS — F410 Panic disorder [episodic paroxysmal anxiety] without agoraphobia: Secondary | ICD-10-CM | POA: Insufficient documentation

## 2019-02-15 DIAGNOSIS — F329 Major depressive disorder, single episode, unspecified: Secondary | ICD-10-CM
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
## 2019-02-22 ENCOUNTER — Other Ambulatory Visit: Payer: Self-pay

## 2019-02-22 ENCOUNTER — Encounter (HOSPITAL_BASED_OUTPATIENT_CLINIC_OR_DEPARTMENT_OTHER): Payer: Self-pay

## 2019-02-22 ENCOUNTER — Emergency Department (HOSPITAL_BASED_OUTPATIENT_CLINIC_OR_DEPARTMENT_OTHER)
Admission: EM | Admit: 2019-02-22 | Discharge: 2019-02-22 | Disposition: A | Discharge status: Home / Self Care | Payer: Medicaid Other | Attending: Emergency Medicine | Admitting: Emergency Medicine

## 2019-02-22 DIAGNOSIS — F17210 Nicotine dependence, cigarettes, uncomplicated: Secondary | ICD-10-CM | POA: Insufficient documentation

## 2019-02-22 DIAGNOSIS — G4700 Insomnia, unspecified: Secondary | ICD-10-CM | POA: Insufficient documentation

## 2019-02-22 DIAGNOSIS — Z79899 Other long term (current) drug therapy: Secondary | ICD-10-CM | POA: Insufficient documentation

## 2019-02-22 DIAGNOSIS — J449 Chronic obstructive pulmonary disease, unspecified: Secondary | ICD-10-CM | POA: Insufficient documentation

## 2019-02-22 DIAGNOSIS — K0889 Other specified disorders of teeth and supporting structures: Secondary | ICD-10-CM | POA: Insufficient documentation

## 2019-02-22 HISTORY — DX: Insomnia, unspecified: G47.00

## 2019-02-22 HISTORY — DX: Chronic obstructive pulmonary disease, unspecified: J44.9

## 2019-02-22 MED ORDER — PENICILLIN V POTASSIUM 500 MG PO TABS: 500.0000 mg | ORAL_TABLET | Freq: Four times a day (QID) | ORAL | 0 refills | Status: AC

## 2019-02-22 MED ORDER — NAPROXEN 500 MG PO TABS: 500.0000 mg | ORAL_TABLET | Freq: Two times a day (BID) | ORAL | 0 refills | Status: DC

## 2019-02-22 NOTE — Discharge Instructions (Signed)
Call one of the dentists offices provided to schedule an appointment for re-evaluation and further management within the next 48 hours.   I have prescribed you Penicillin VK which is an antibiotic to treat the infection and Naproxen which is an anti-inflammatory medicine to treat the pain.   Please take all of your antibiotics until finished. You may develop abdominal discomfort or diarrhea from the antibiotic.  You may help offset this with probiotics which you can buy at the store (ask your pharmacist if unable to find) or get probiotics in the form of eating yogurt. Do not eat or take the probiotics until 2 hours after your antibiotic. If you are unable to tolerate these side effects follow-up with your primary care provider or return to the emergency department.   If you begin to experience any blistering, rashes, swelling, or difficulty breathing seek medical care for evaluation of potentially more serious side effects.   Be sure to eat something when taking the Naproxen as it can cause stomach upset and at worst stomach bleeding. Do not take additional non steroidal anti-inflammatory medicines such as Ibuprofen, Aleve, Advil, Mobic, Diclofenac, or goodie powder while taking Naproxen. You may supplement with Tylenol.   We have prescribed you new medication(s) today. Discuss the medications prescribed today with your pharmacist as they can have adverse effects and interactions with your other medicines including over the counter and prescribed medications. Seek medical evaluation if you start to experience new or abnormal symptoms after taking one of these medicines, seek care immediately if you start to experience difficulty breathing, feeling of your throat closing, facial swelling, or rash as these could be indications of a more serious allergic reaction  If you start to experience and new or worsening symptoms return to the emergency department. If you start to experience fever, chills, neck  stiffness/pain, or inability to move your neck or open your mouth come back to the emergency department immediately.   

## 2019-02-22 NOTE — ED Notes (Signed)
Pt verbalized understanding of dc instructions.

## 2019-02-22 NOTE — ED Provider Notes (Signed)
[facility] EMERGENCY DEPARTMENT Provider Note   CSN: [id number] Arrival date & time: 02/22/19  1741    History   Chief Complaint Chief Complaint  Patient presents with  . Dental Pain    HPI Allison Le is a 58 y.o. female with a hx of tobacco abuse, COPD, migraines, anxiety, & depression who presents to the ED with complaints of R upper dental pain x 2 days. Has hx of issues with tooth in R upper gumline, prior filling fell out about 1.5 months ago, yesterday developed sharp constant pain to the tooth. Worse w/ palpation, somewhat alleviated w/ ibuprofen & oragel. She has concern for infection. Denies fever, chills, intra-oral drainage, swelling beneath the tongue, vomiting, dysphagia, or dyspnea. Patient states she cannot afford to see      HPI  Past Medical History:  Diagnosis Date  . Anxiety and depression    age 44  . Colitis   . Depression   . Migraine 2016   Headaches all her life.  Diagnosed with Migraines at [facility]    Patient Active Problem List   Diagnosis Date Noted  . Chronic obstructive pulmonary disease (HCC) 02/22/2019  . Insomnia 02/22/2019  . Anxiety and depression   . Migraine 09/30/2014    Past Surgical History:  Procedure Laterality Date  . ABDOMINAL HYSTERECTOMY    . HIP SURGERY       OB History   No obstetric history on file.      Home Medications    Prior to Admission medications   Medication Sig Start Date End Date Taking? Authorizing Provider  Ascorbic Acid (VITAMIN C PO) Take 2 tablets by mouth daily.    [provider]  Aspirin-Acetaminophen-Caffeine (GOODY HEADACHE PO) Take 1 packet by mouth as needed (pain,headache).    [provider]  b complex vitamins tablet Take 1 tablet by mouth daily.    [provider]  buPROPion (WELLBUTRIN XL) 300 MG 24 hr tablet Take 300 mg by mouth daily. 11/13/16   [provider]  cetirizine (ZYRTEC ALLERGY) 10 MG tablet Take 1 tablet (10 mg total)  by mouth daily. Patient not taking: Reported on 02/15/2019 01/16/18   Emi Holes, PA-C  cholecalciferol (VITAMIN D3) 25 MCG (1000 UT) tablet Take 1,000 Units by mouth daily.    [provider]  clonazePAM (KLONOPIN) 0.5 MG tablet Take 0.5 mg by mouth 2 (two) times daily as needed for anxiety. 12/09/16   [provider]  dicyclomine (BENTYL) 20 MG tablet Take 1 tablet (20 mg total) by mouth 2 (two) times daily. Patient not taking: Reported on 02/15/2019 05/29/17   Kellie Shropshire, PA-C  FLUoxetine (PROZAC) 40 MG capsule Take 40 mg by mouth daily. 11/13/16   [provider]  HYDROcodone-acetaminophen (NORCO) 5-325 MG tablet Take 1 tablet by mouth every 6 (six) hours as needed for moderate pain. 01/28/17   Kirichenko, Tatyana, PA-C  ibuprofen (ADVIL,MOTRIN) 200 MG tablet Take 400-600 mg by mouth every 6 (six) hours as needed.    [provider]  Multiple Vitamin (MULTIVITAMIN) capsule Take 1 capsule by mouth daily.    [provider]  ondansetron (ZOFRAN ODT) 8 MG disintegrating tablet Take 1 tablet (8 mg total) by mouth every 8 (eight) hours as needed for nausea or vomiting. Patient not taking: Reported on 03/09/2017 01/28/17   Jaynie Crumble, PA-C  Zinc 100 MG TABS Take 100 mg by mouth daily.    [provider]    [facility]  History No family history on file.  Social History Social History   Tobacco Use  . Smoking status: Current Every Day Smoker    Types: Cigarettes  . Smokeless tobacco: Never Used  Substance Use Topics  . Alcohol use: Yes    Comment: occ  . Drug use: No     Allergies   Morphine and related; Nitrofurantoin; and Tramadol   Review of Systems Review of Systems  Constitutional: Negative for chills and fever.  HENT: Positive for dental problem. Negative for ear pain, sore throat, tinnitus, trouble swallowing and voice change.   Respiratory: Negative for shortness of breath.   Cardiovascular: Negative for chest  pain.  Gastrointestinal: Negative for nausea and vomiting.   Physical Exam Updated Vital Signs BP (!) 155/94 (BP Location: Right Arm)   Pulse 70   Temp 98 F (36.7 C) (Oral)   Resp 18   Ht  (1.575 m)   Wt 51.7 kg   SpO2 99%   BMI 20.85 kg/m   Physical Exam Vitals signs and nursing note reviewed.  Constitutional:      General: She is not in acute distress.    Appearance: She is well-developed. She is not toxic-appearing.  HENT:     Head: Normocephalic and atraumatic.     Right Ear: Tympanic membrane is not perforated, erythematous, retracted or bulging.     Left Ear: Tympanic membrane is not perforated, erythematous, retracted or bulging.     Nose: Nose normal.     Mouth/Throat:     Pharynx: Uvula midline. No oropharyngeal exudate, posterior oropharyngeal erythema or uvula swelling.     Tonsils: No tonsillar abscesses.      Comments: Posterior oropharynx is symmetric appearing. Patient tolerating own secretions without difficulty. No trismus. No drooling. No hot potato voice. No swelling beneath the tongue, submandibular compartment is soft.  Eyes:     General:        Right eye: No discharge.        Left eye: No discharge.     Conjunctiva/sclera: Conjunctivae normal.  Neck:     Musculoskeletal: Normal range of motion and neck supple.  Lymphadenopathy:     Cervical: No cervical adenopathy.  Neurological:     Mental Status: She is alert.  Psychiatric:        Behavior: Behavior normal.        Thought Content: Thought content normal.      ED Treatments / Results  Labs (all labs ordered are listed, but only abnormal results are displayed) Labs Reviewed - No data to display  EKG None  Radiology No results found.  Procedures Procedures (including critical care time)  Medications Ordered in ED Medications - No data to display   Initial Impression / Assessment and Plan / ED Course  I have reviewed the triage vital signs and the nursing notes.   Pertinent labs & imaging results that were available during my care of the patient were reviewed by me and considered in my medical decision making (see chart for details).    Patient presents with dental pain. Patient is nontoxic appearing, vitals without significant abnormality, BP elevated- doubt HTN emergency. No gross abscess.  Exam unconcerning for Ludwig's angina or deep space infection.  Will treat with Penicillin VK and Naproxen.  Urged patient to follow-up with dentist, dental resources were provided for reduced cost type care potential.  Discussed treatment plan and need for follow up as well as return precautions. Provided opportunity for questions, patient  confirmed understanding and is agreeable to plan.   Final Clinical Impressions(s) / ED Diagnoses   Final diagnoses:  Pain, dental    ED Discharge Orders         Ordered    penicillin v potassium (VEETID) 500 MG tablet  4 times daily     02/22/19 1800    naproxen (NAPROSYN) 500 MG tablet  2 times daily     02/22/19 [address]           Ashvin Adelson R, PA-C 02/22/19 1808    Maia PlanLong, Joshua G, MD 02/22/19 [phone]

## 2019-02-22 NOTE — ED Triage Notes (Signed)
C/o right upper toothache x 2 days-NAD-steady gait

## 2019-03-08 MED FILL — buPROPion HCL ER (XL) 300 M: 300 | 30 days supply | Qty: 30 | Fill #0

## 2019-03-08 MED FILL — FLUoxetine HCL 40 MG CAPS: 40 | 30 days supply | Qty: 30 | Fill #0

## 2019-03-29 ENCOUNTER — Encounter (HOSPITAL_BASED_OUTPATIENT_CLINIC_OR_DEPARTMENT_OTHER): Payer: Self-pay | Admitting: *Deleted

## 2019-03-29 ENCOUNTER — Other Ambulatory Visit: Payer: Self-pay

## 2019-03-29 ENCOUNTER — Emergency Department (HOSPITAL_BASED_OUTPATIENT_CLINIC_OR_DEPARTMENT_OTHER)
Admission: EM | Admit: 2019-03-29 | Discharge: 2019-03-29 | Disposition: A | Discharge status: Home / Self Care | Payer: Medicaid Other | Attending: Emergency Medicine | Admitting: Emergency Medicine

## 2019-03-29 DIAGNOSIS — K029 Dental caries, unspecified: Secondary | ICD-10-CM | POA: Insufficient documentation

## 2019-03-29 DIAGNOSIS — Z79899 Other long term (current) drug therapy: Secondary | ICD-10-CM | POA: Insufficient documentation

## 2019-03-29 DIAGNOSIS — J449 Chronic obstructive pulmonary disease, unspecified: Secondary | ICD-10-CM | POA: Insufficient documentation

## 2019-03-29 DIAGNOSIS — F17210 Nicotine dependence, cigarettes, uncomplicated: Secondary | ICD-10-CM | POA: Insufficient documentation

## 2019-03-29 MED ORDER — AMOXICILLIN 500 MG PO CAPS
500.0000 mg | ORAL_CAPSULE | Freq: Once | ORAL | Status: AC
  Administered 2019-03-29: 500 mg via ORAL
  Filled 2019-03-29: qty 1

## 2019-03-29 MED ORDER — AMOXICILLIN 500 MG PO CAPS: 500.0000 mg | ORAL_CAPSULE | Freq: Three times a day (TID) | ORAL | 0 refills | Status: DC

## 2019-03-29 MED ORDER — HYDROCODONE-ACETAMINOPHEN 5-325 MG PO TABS: 1.0000 | ORAL_TABLET | ORAL | 0 refills | Status: AC | PRN

## 2019-03-29 NOTE — ED Triage Notes (Signed)
Rt upper tooth pain x 3 days  Hx of same  States cannot afford a dentist

## 2019-03-29 NOTE — ED Provider Notes (Signed)
[facility] EMERGENCY DEPARTMENT Provider Note   CSN: [id number] Arrival date & time: 03/29/19  1006    History   Chief Complaint Chief Complaint  Patient presents with  . Dental Pain    HPI Allison Le is a 58 y.o. female.     Pt presents to the ED today with dental pain.  She's had problems with this tooth for a few months.  She came to the ED last month for the same.  She took the abx and pain went away.  She has been unable to afford a dentist.  The pt denies sob or cough.  ? Fever last night.  None today, but she's been taking ibuprofen and tylenol.       Past Medical History:  Diagnosis Date  . Anxiety and depression    age 58  . Colitis   . Depression   . Migraine 2016   Headaches all her life.  Diagnosed with Migraines at [facility]    Patient Active Problem List   Diagnosis Date Noted  . Chronic obstructive pulmonary disease (HCC) 02/22/2019  . Insomnia 02/22/2019  . Anxiety and depression   . Migraine 09/30/2014    Past Surgical History:  Procedure Laterality Date  . ABDOMINAL HYSTERECTOMY    . HIP SURGERY       OB History   No obstetric history on file.      Home Medications    Prior to Admission medications   Medication Sig Start Date End Date Taking? Authorizing Provider  amoxicillin (AMOXIL) 500 MG capsule Take 1 capsule (500 mg total) by mouth 3 (three) times daily. 03/29/19   Jacalyn LefevreHaviland, Taylorann Tkach, MD  Ascorbic Acid (VITAMIN C PO) Take 2 tablets by mouth daily.    [provider]  Aspirin-Acetaminophen-Caffeine (GOODY HEADACHE PO) Take 1 packet by mouth as needed (pain,headache).    [provider]  b complex vitamins tablet Take 1 tablet by mouth daily.    [provider]  buPROPion (WELLBUTRIN XL) 300 MG 24 hr tablet Take 300 mg by mouth daily. 11/13/16   [provider]  cetirizine (ZYRTEC ALLERGY) 10 MG tablet Take 1 tablet (10 mg total) by mouth daily. Patient not taking: Reported on 02/15/2019  01/16/18   Emi HolesLaw, Alexandra M, PA-C  cholecalciferol (VITAMIN D3) 25 MCG (1000 UT) tablet Take 1,000 Units by mouth daily.    [provider]  clonazePAM (KLONOPIN) 0.5 MG tablet Take 0.5 mg by mouth 2 (two) times daily as needed for anxiety. 12/09/16   [provider]  dicyclomine (BENTYL) 20 MG tablet Take 1 tablet (20 mg total) by mouth 2 (two) times daily. Patient not taking: Reported on 02/15/2019 05/29/17   Kellie ShropshireShrosbree, Emily J, PA-C  FLUoxetine (PROZAC) 40 MG capsule Take 40 mg by mouth daily. 11/13/16   [provider]  HYDROcodone-acetaminophen (NORCO/VICODIN) 5-325 MG tablet Take 1 tablet by mouth every 4 (four) hours as needed. 03/29/19   Jacalyn LefevreHaviland, Kinsleigh Ludolph, MD  ibuprofen (ADVIL,MOTRIN) 200 MG tablet Take 400-600 mg by mouth every 6 (six) hours as needed.    [provider]  Multiple Vitamin (MULTIVITAMIN) capsule Take 1 capsule by mouth daily.    [provider]  naproxen (NAPROSYN) 500 MG tablet Take 1 tablet (500 mg total) by mouth 2 (two) times daily. 02/22/19   Petrucelli, Samantha R, PA-C  ondansetron (ZOFRAN ODT) 8 MG disintegrating tablet Take 1 tablet (8 mg total) by mouth every 8 (eight) hours as needed for nausea or  vomiting. Patient not taking: Reported on 03/09/2017 01/28/17   Jaynie CrumbleKirichenko, Tatyana, PA-C  Zinc 100 MG TABS Take 100 mg by mouth daily.    [provider]    Family History No family history on file.  Social History Social History   Tobacco Use  . Smoking status: Current Every Day Smoker    Types: Cigarettes  . Smokeless tobacco: Never Used  Substance Use Topics  . Alcohol use: Yes    Comment: occ  . Drug use: No     Allergies   Morphine and related, Nitrofurantoin, and Tramadol   Review of Systems Review of Systems  HENT: Positive for dental problem.   All other systems reviewed and are negative.    Physical Exam Updated Vital Signs BP (!) 130/91 (BP Location: Right Arm)   Pulse 73   Temp 98.3 F  (36.8 C) (Oral)   Resp 18   SpO2 95%   Physical Exam Vitals signs and nursing note reviewed.  Constitutional:      Appearance: Normal appearance.  HENT:     Head: Normocephalic and atraumatic.     Right Ear: External ear normal.     Left Ear: External ear normal.     Nose: Nose normal.     Mouth/Throat:     Mouth: Mucous membranes are moist.     Dentition: Dental caries present.     Pharynx: Oropharynx is clear.  Eyes:     Extraocular Movements: Extraocular movements intact.     Conjunctiva/sclera: Conjunctivae normal.     Pupils: Pupils are equal, round, and reactive to light.  Neck:     Musculoskeletal: Normal range of motion and neck supple.  Cardiovascular:     Rate and Rhythm: Normal rate and regular rhythm.     Pulses: Normal pulses.     Heart sounds: Normal heart sounds.  Pulmonary:     Effort: Pulmonary effort is normal.     Breath sounds: Normal breath sounds.  Abdominal:     General: Abdomen is flat. Bowel sounds are normal.     Palpations: Abdomen is soft.  Musculoskeletal: Normal range of motion.  Skin:    General: Skin is warm.     Capillary Refill: Capillary refill takes less than 2 seconds.  Neurological:     General: No focal deficit present.     Mental Status: She is alert and oriented to person, place, and time.  Psychiatric:        Mood and Affect: Mood normal.        Behavior: Behavior normal.      ED Treatments / Results  Labs (all labs ordered are listed, but only abnormal results are displayed) Labs Reviewed - No data to display  EKG None  Radiology No results found.  Procedures Procedures (including critical care time)  Medications Ordered in ED Medications  amoxicillin (AMOXIL) capsule 500 mg (500 mg Oral Given 03/29/19 1034)     Initial Impression / Assessment and Plan / ED Course  I have reviewed the triage vital signs and the nursing notes.  Pertinent labs & imaging results that were available during my care of the  patient were reviewed by me and considered in my medical decision making (see chart for details).        Pt d/c home with amox/lortab.  She is given the number of several dentists and clinics to try for an appt.  Return if worse.   Final Clinical Impressions(s) / ED Diagnoses  Final diagnoses:  Dental caries    ED Discharge Orders         Ordered    amoxicillin (AMOXIL) 500 MG capsule  3 times daily     03/29/19 1032    HYDROcodone-acetaminophen (NORCO/VICODIN) 5-325 MG tablet  Every 4 hours PRN     03/29/19 1032           Isla Pence, MD 03/29/19 1045

## 2019-04-07 MED FILL — buPROPion HCL ER (XL) 300 M: 300 | 30 days supply | Qty: 30 | Fill #1

## 2019-04-07 MED FILL — FLUoxetine HCL 40 MG CAPS: 40 | 30 days supply | Qty: 30 | Fill #1

## 2019-05-07 MED FILL — buPROPion HCL ER (XL) 300 M: 300 | 30 days supply | Qty: 30 | Fill #2

## 2019-05-07 MED FILL — FLUoxetine HCL 40 MG CAPS: 40 | 30 days supply | Qty: 30 | Fill #2

## 2019-05-18 ENCOUNTER — Encounter: Payer: Self-pay | Admitting: Internal Medicine

## 2019-06-08 MED FILL — buPROPion HCL ER (XL) 300 M: 300 | 30 days supply | Qty: 30 | Fill #3

## 2019-06-08 MED FILL — FLUoxetine HCL 40 MG CAPS: 40 | 30 days supply | Qty: 30 | Fill #3

## 2019-07-07 MED FILL — FLUoxetine HCL 40 MG CAPS: 40 | 30 days supply | Qty: 30 | Fill #0

## 2019-07-07 MED FILL — buPROPion HCL ER (XL) 300 M: 300 | 30 days supply | Qty: 30 | Fill #0

## 2019-08-03 MED FILL — BUPROPION HCL ER (XL) 300 M: 300 | 30 days supply | Qty: 30 | Fill #1

## 2019-08-03 MED FILL — FLUoxetine HCL 40 MG CAPS: 40 | 30 days supply | Qty: 30 | Fill #1

## 2019-09-04 ENCOUNTER — Emergency Department (HOSPITAL_BASED_OUTPATIENT_CLINIC_OR_DEPARTMENT_OTHER)
Admission: EM | Admit: 2019-09-04 | Discharge: 2019-09-04 | Disposition: A | Discharge status: Home / Self Care | Payer: Medicaid Other | Attending: Emergency Medicine | Admitting: Emergency Medicine

## 2019-09-04 ENCOUNTER — Encounter (HOSPITAL_BASED_OUTPATIENT_CLINIC_OR_DEPARTMENT_OTHER): Payer: Self-pay | Admitting: Emergency Medicine

## 2019-09-04 ENCOUNTER — Other Ambulatory Visit: Payer: Self-pay

## 2019-09-04 DIAGNOSIS — Z888 Allergy status to other drugs, medicaments and biological substances status: Secondary | ICD-10-CM | POA: Insufficient documentation

## 2019-09-04 DIAGNOSIS — R112 Nausea with vomiting, unspecified: Secondary | ICD-10-CM | POA: Insufficient documentation

## 2019-09-04 DIAGNOSIS — Z885 Allergy status to narcotic agent status: Secondary | ICD-10-CM | POA: Insufficient documentation

## 2019-09-04 DIAGNOSIS — Z79899 Other long term (current) drug therapy: Secondary | ICD-10-CM | POA: Insufficient documentation

## 2019-09-04 DIAGNOSIS — K029 Dental caries, unspecified: Secondary | ICD-10-CM | POA: Insufficient documentation

## 2019-09-04 DIAGNOSIS — F17210 Nicotine dependence, cigarettes, uncomplicated: Secondary | ICD-10-CM | POA: Insufficient documentation

## 2019-09-04 DIAGNOSIS — K0889 Other specified disorders of teeth and supporting structures: Secondary | ICD-10-CM | POA: Insufficient documentation

## 2019-09-04 MED ORDER — ONDANSETRON HCL 4 MG PO TABS: 4.0000 mg | ORAL_TABLET | Freq: Every day | ORAL | 0 refills | Status: DC | PRN

## 2019-09-04 MED ORDER — PENICILLIN V POTASSIUM 500 MG PO TABS: 500.0000 mg | ORAL_TABLET | Freq: Four times a day (QID) | ORAL | 0 refills | Status: AC

## 2019-09-04 NOTE — ED Provider Notes (Signed)
[facility] EMERGENCY DEPARTMENT Provider Note   CSN: [id number] Arrival date & time: 09/04/19  2010     History   Chief Complaint Chief Complaint  Patient presents with   Dental Pain    HPI Allison Le is a 58 y.o. female.     Patient with history of multiple dental caries presents for recurrent dental pain.  Patient states that she has had pain off and on and this tooth.  She has been to the emergency twice before for this same pain.  Patient says that the pain will come and go.  Patient will usually self treat with Orajel and ibuprofen and the pain will get better.  Patient normally keeps small amount of putty in the tooth for protection.  She states that today the putty became dislodged while she was eating and not.  The knot got lodged into the tooth and caused extensive pain.  Patient said that she applied Orajel and took 100 mg of ibuprofen approximately 45 minutes ago.  Reports the pain is much better.  Says that she still has some pain of her right maxillary sinus.  Denies any fevers or chills.  Patient does endorse nausea for the past 3 days.  Denies any sick contacts, diarrhea, dysuria, hematuria, chest pain, shortness of breath.  She has been able tolerate some p.o. but very little.     Past Medical History:  Diagnosis Date   Anxiety and depression    age 78   Colitis    Depression    Migraine 2016   Headaches all her life.  Diagnosed with Migraines at [facility]    Patient Active Problem List   Diagnosis Date Noted   Chronic obstructive pulmonary disease (HCC) 02/22/2019   Insomnia 02/22/2019   Anxiety and depression    Migraine 09/30/2014    Past Surgical History:  Procedure Laterality Date   ABDOMINAL HYSTERECTOMY     HIP SURGERY       OB History   No obstetric history on file.      Home Medications    Prior to Admission medications   Medication Sig Start Date End Date Taking? Authorizing Provider  Ascorbic Acid (VITAMIN  C PO) Take 2 tablets by mouth daily.    [provider]  Aspirin-Acetaminophen-Caffeine (GOODY HEADACHE PO) Take 1 packet by mouth as needed (pain,headache).    [provider]  b complex vitamins tablet Take 1 tablet by mouth daily.    [provider]  buPROPion (WELLBUTRIN XL) 300 MG 24 hr tablet Take 300 mg by mouth daily. 11/13/16   [provider]  cetirizine (ZYRTEC ALLERGY) 10 MG tablet Take 1 tablet (10 mg total) by mouth daily. Patient not taking: Reported on 02/15/2019 01/16/18   Emi Holes, PA-C  cholecalciferol (VITAMIN D3) 25 MCG (1000 UT) tablet Take 1,000 Units by mouth daily.    [provider]  clonazePAM (KLONOPIN) 0.5 MG tablet Take 0.5 mg by mouth 2 (two) times daily as needed for anxiety. 12/09/16   [provider]  dicyclomine (BENTYL) 20 MG tablet Take 1 tablet (20 mg total) by mouth 2 (two) times daily. Patient not taking: Reported on 02/15/2019 05/29/17   Kellie Shropshire, PA-C  FLUoxetine (PROZAC) 40 MG capsule Take 40 mg by mouth daily. 11/13/16   [provider]  HYDROcodone-acetaminophen (NORCO/VICODIN) 5-325 MG tablet Take 1 tablet by mouth every 4 (four) hours as needed. 03/29/19   Jacalyn Lefevre, MD  ibuprofen (ADVIL,MOTRIN) 200  MG tablet Take 400-600 mg by mouth every 6 (six) hours as needed.    [provider]  Multiple Vitamin (MULTIVITAMIN) capsule Take 1 capsule by mouth daily.    [provider]  naproxen (NAPROSYN) 500 MG tablet Take 1 tablet (500 mg total) by mouth 2 (two) times daily. 02/22/19   Petrucelli, Samantha R, PA-C  ondansetron (ZOFRAN ODT) 8 MG disintegrating tablet Take 1 tablet (8 mg total) by mouth every 8 (eight) hours as needed for nausea or vomiting. Patient not taking: Reported on 03/09/2017 01/28/17   Jaynie CrumbleKirichenko, Tatyana, PA-C  ondansetron (ZOFRAN) 4 MG tablet Take 1 tablet (4 mg total) by mouth daily as needed for up to 10 doses for nausea or vomiting. 09/04/19    Garnette Gunnerhompson, Wynn Kernes B, MD  penicillin v potassium (VEETID) 500 MG tablet Take 1 tablet (500 mg total) by mouth 4 (four) times daily for 5 days. 09/04/19 09/09/19  Garnette Gunnerhompson, Trachelle Low B, MD  Zinc 100 MG TABS Take 100 mg by mouth daily.    [provider]    Family History History reviewed. No pertinent family history.  Social History Social History   Tobacco Use   Smoking status: Current Every Day Smoker    Types: Cigarettes   Smokeless tobacco: Never Used  Substance Use Topics   Alcohol use: Yes    Comment: occ   Drug use: No     Allergies   Morphine and related, Nitrofurantoin, and Tramadol   Review of Systems Review of Systems As per HPI  Physical Exam Updated Vital Signs BP (!) 167/94 (BP Location: Left Arm)    Pulse 78    Temp 98.7 F (37.1 C) (Oral)    Resp 18    Ht 5\' 2"  (1.575 m)    Wt 50.8 kg    SpO2 100%    BMI 20.49 kg/m   Physical Exam Constitutional:      Appearance: Normal appearance.  HENT:     Head: Normocephalic and atraumatic.     Nose: Nose normal.     Mouth/Throat:     Lips: Pink.     Mouth: Mucous membranes are moist. No injury or oral lesions.     Dentition: Dental caries present. No gingival swelling, dental abscesses or gum lesions.     Tongue: No lesions.     Palate: No mass.     Pharynx: Oropharynx is clear.     Tonsils: No tonsillar exudate or tonsillar abscesses.   Neck:     Musculoskeletal: Normal range of motion. No muscular tenderness.  Cardiovascular:     Rate and Rhythm: Normal rate and regular rhythm.  Pulmonary:     Effort: Pulmonary effort is normal.     Breath sounds: Normal breath sounds.  Abdominal:     General: Abdomen is flat. There is no distension.     Tenderness: There is no abdominal tenderness.  Musculoskeletal: Normal range of motion.        General: No swelling or tenderness.  Skin:    General: Skin is warm and dry.     Capillary Refill: Capillary refill takes less than 2 seconds.  Neurological:      General: No focal deficit present.     Mental Status: She is alert and oriented to person, place, and time.     Cranial Nerves: No cranial nerve deficit.  Psychiatric:        Mood and Affect: Mood normal.        Behavior: Behavior normal.  ED Treatments / Results  Labs (all labs ordered are listed, but only abnormal results are displayed) Labs Reviewed - No data to display  EKG None  Radiology No results found.  Procedures Procedures (including critical care time)  Medications Ordered in ED Medications - No data to display   Initial Impression / Assessment and Plan / ED Course  I have reviewed the triage vital signs and the nursing notes.  Pertinent labs & imaging results that were available during my care of the patient were reviewed by me and considered in my medical decision making (see chart for details).       Patient with known history of multiple dental caries presents with extensive pain over which she has been to the ED for twice.  Does not appear to be overtly infected.  Likely has pain from trauma of not.  Recommend patient follow-up with dentistry.  Will prescribe antibiotic in case patient does develop infection over the next few days.  Return precautions discussed.  Nausea likely related to viral gastroenteritis.  Tolerating p.o. well.  Patient may take Zofran as needed.   Final Clinical Impressions(s) / ED Diagnoses   Final diagnoses:  Pain due to dental caries  Nausea and vomiting, intractability of vomiting not specified, unspecified vomiting type    ED Discharge Orders         Ordered    penicillin v potassium (VEETID) 500 MG tablet  4 times daily     09/04/19 2055    ondansetron (ZOFRAN) 4 MG tablet  Daily PRN     09/04/19 2055           Bonnita Hollow, MD 09/04/19 2115    Quintella Reichert, MD 09/05/19 1746

## 2019-09-04 NOTE — ED Notes (Signed)
ED Provider at bedside. 

## 2019-09-04 NOTE — ED Notes (Signed)
EDP at bedside  

## 2019-09-04 NOTE — Discharge Instructions (Signed)
Please follow up with dentistry to have your tooth assessed. It will likely need to be pulled or repaired. You may continue to use ibuprofen and orajel as needed for pain. We are prescribing an antibiotic. If you develop any swelling or worsening pain, you should fill it. Please come back to the ED if the pain does not get better with antibiotics, you develop significant fevers, chills, or any other worrisome symptoms.   You may take Zofran as needed for nausea.  Please come back to the ED if you develop any abdominal pain, severe vomiting improved with medication, fevers, or any other worrisome symptoms.

## 2019-09-04 NOTE — ED Triage Notes (Signed)
Patient states that she is having pain to the right side of her jaw from a known broken tooth. Patient has been here several times for the same

## 2019-09-07 MED FILL — FLUoxetine HCL 40 MG CAPS: 40 | 30 days supply | Qty: 30 | Fill #0

## 2019-09-07 MED FILL — BUPROPION HCL XL 300 MG TAB: 300 | 30 days supply | Qty: 30 | Fill #0

## 2019-10-03 ENCOUNTER — Encounter (HOSPITAL_BASED_OUTPATIENT_CLINIC_OR_DEPARTMENT_OTHER): Payer: Self-pay

## 2019-10-03 ENCOUNTER — Emergency Department (HOSPITAL_BASED_OUTPATIENT_CLINIC_OR_DEPARTMENT_OTHER)
Admission: EM | Admit: 2019-10-03 | Discharge: 2019-10-03 | Disposition: A | Discharge status: Home / Self Care | Payer: 59 | Attending: Emergency Medicine | Admitting: Emergency Medicine

## 2019-10-03 ENCOUNTER — Other Ambulatory Visit: Payer: Self-pay

## 2019-10-03 DIAGNOSIS — N3001 Acute cystitis with hematuria: Secondary | ICD-10-CM | POA: Diagnosis not present

## 2019-10-03 DIAGNOSIS — Z79899 Other long term (current) drug therapy: Secondary | ICD-10-CM | POA: Insufficient documentation

## 2019-10-03 DIAGNOSIS — J449 Chronic obstructive pulmonary disease, unspecified: Secondary | ICD-10-CM | POA: Diagnosis not present

## 2019-10-03 DIAGNOSIS — F17210 Nicotine dependence, cigarettes, uncomplicated: Secondary | ICD-10-CM | POA: Insufficient documentation

## 2019-10-03 DIAGNOSIS — R350 Frequency of micturition: Secondary | ICD-10-CM | POA: Diagnosis present

## 2019-10-03 LAB — URINALYSIS, MICROSCOPIC (REFLEX): RBC / HPF: >50 RBC/hpf (ref 0–5)

## 2019-10-03 LAB — URINALYSIS, ROUTINE W REFLEX MICROSCOPIC
Glucose, UA: >=500 mg/dL — AB
Ketones, ur: 15 mg/dL — AB
Nitrite: POSITIVE — AB
Protein, ur: 100 mg/dL — AB
Specific Gravity, Urine: 1.025 (ref 1.005–1.030)
pH: 5 (ref 5.0–8.0)

## 2019-10-03 MED ORDER — LEVOFLOXACIN 750 MG PO TABS
750.0000 mg | ORAL_TABLET | Freq: Once | ORAL | Status: AC
  Administered 2019-10-03: 750 mg via ORAL
  Filled 2019-10-03: qty 1

## 2019-10-03 MED ORDER — LEVOFLOXACIN 500 MG PO TABS: 750.0000 mg | ORAL_TABLET | Freq: Every day | ORAL | 0 refills | Status: AC

## 2019-10-03 NOTE — ED Triage Notes (Signed)
Pt c/o burning with urination and pain across lower back. Reports chills and sweats. Pt started taking penicillin that she had on hand and had no relief.

## 2019-10-03 NOTE — ED Provider Notes (Signed)
[facility] EMERGENCY DEPARTMENT Provider Note   CSN: [id number] Arrival date & time: 10/03/19  1536     History Chief Complaint  Patient presents with  . Urinary Tract Infection    Allison Le is a 59 y.o. female   Hx of UTI and possible pyelonephritis ("kidney infection") several years ago Presenting with 2 days of urinary cramping, frequency, pressure, low back pain Feels like UTI No fevers or chills  On penicillin for tooth infection, not helping symptoms  Allergies to macrobid, morphine, tramadol  HPI     Past Medical History:  Diagnosis Date  . Anxiety and depression    age 11  . Colitis   . Depression   . Migraine 2016   Headaches all her life.  Diagnosed with Migraines at [facility]    Patient Active Problem List   Diagnosis Date Noted  . Chronic obstructive pulmonary disease (HCC) 02/22/2019  . Insomnia 02/22/2019  . Anxiety and depression   . Migraine 09/30/2014    Past Surgical History:  Procedure Laterality Date  . ABDOMINAL HYSTERECTOMY    . HIP SURGERY       OB History   No obstetric history on file.     No family history on file.  Social History   Tobacco Use  . Smoking status: Current Every Day Smoker    Types: Cigarettes  . Smokeless tobacco: Never Used  Substance Use Topics  . Alcohol use: Yes    Comment: occ  . Drug use: No    Home Medications Prior to Admission medications   Medication Sig Start Date End Date Taking? Authorizing Provider  Ascorbic Acid (VITAMIN C PO) Take 2 tablets by mouth daily.    [provider]  Aspirin-Acetaminophen-Caffeine (GOODY HEADACHE PO) Take 1 packet by mouth as needed (pain,headache).    [provider]  b complex vitamins tablet Take 1 tablet by mouth daily.    [provider]  buPROPion (WELLBUTRIN XL) 300 MG 24 hr tablet Take 300 mg by mouth daily. 11/13/16   [provider]  cetirizine (ZYRTEC ALLERGY) 10 MG tablet Take 1 tablet (10 mg  total) by mouth daily. Patient not taking: Reported on 02/15/2019 01/16/18   Emi Holes, PA-C  cholecalciferol (VITAMIN D3) 25 MCG (1000 UT) tablet Take 1,000 Units by mouth daily.    [provider]  clonazePAM (KLONOPIN) 0.5 MG tablet Take 0.5 mg by mouth 2 (two) times daily as needed for anxiety. 12/09/16   [provider]  dicyclomine (BENTYL) 20 MG tablet Take 1 tablet (20 mg total) by mouth 2 (two) times daily. Patient not taking: Reported on 02/15/2019 05/29/17   Kellie Shropshire, PA-C  FLUoxetine (PROZAC) 40 MG capsule Take 40 mg by mouth daily. 11/13/16   [provider]  HYDROcodone-acetaminophen (NORCO/VICODIN) 5-325 MG tablet Take 1 tablet by mouth every 4 (four) hours as needed. 03/29/19   Jacalyn Lefevre, MD  ibuprofen (ADVIL,MOTRIN) 200 MG tablet Take 400-600 mg by mouth every 6 (six) hours as needed.    [provider]  levofloxacin (LEVAQUIN) 500 MG tablet Take 1.5 tablets (750 mg total) by mouth daily for 4 days. 10/04/19 10/08/19  Terald Sleeper, MD  Multiple Vitamin (MULTIVITAMIN) capsule Take 1 capsule by mouth daily.    [provider]  naproxen (NAPROSYN) 500 MG tablet Take 1 tablet (500 mg total) by mouth 2 (two) times daily. 02/22/19   Petrucelli, Samantha R, PA-C  ondansetron (ZOFRAN ODT) 8 MG  disintegrating tablet Take 1 tablet (8 mg total) by mouth every 8 (eight) hours as needed for nausea or vomiting. Patient not taking: Reported on 03/09/2017 01/28/17   Jaynie Crumble, PA-C  ondansetron (ZOFRAN) 4 MG tablet Take 1 tablet (4 mg total) by mouth daily as needed for up to 10 doses for nausea or vomiting. 09/04/19   Garnette Gunner, MD  Zinc 100 MG TABS Take 100 mg by mouth daily.    [provider]    Allergies    Morphine and related, Nitrofurantoin, and Tramadol  Review of Systems   Review of Systems  Constitutional: Negative for chills and fever.  Respiratory: Negative for cough and shortness of breath.     Cardiovascular: Negative for chest pain and palpitations.  Gastrointestinal: Positive for abdominal pain and nausea. Negative for constipation, diarrhea and vomiting.  Genitourinary: Positive for dysuria and frequency. Negative for hematuria.  Musculoskeletal: Positive for back pain. Negative for arthralgias.  Psychiatric/Behavioral: Negative for agitation and confusion.  All other systems reviewed and are negative.   Physical Exam Updated Vital Signs BP 125/69   Pulse 70   Temp 98.3 F (36.8 C) (Oral)   Resp 19   Ht 5\' 2"  (1.575 m)   Wt 52.2 kg   SpO2 95%   BMI 21.03 kg/m   Physical Exam Vitals and nursing note reviewed.  Constitutional:      General: She is not in acute distress.    Appearance: She is well-developed.  HENT:     Head: Normocephalic and atraumatic.  Eyes:     Conjunctiva/sclera: Conjunctivae normal.  Cardiovascular:     Rate and Rhythm: Normal rate and regular rhythm.  Pulmonary:     Effort: Pulmonary effort is normal. No respiratory distress.  Abdominal:     General: There is no distension.     Palpations: Abdomen is soft.     Tenderness: There is no abdominal tenderness.  Musculoskeletal:     Cervical back: Neck supple.  Skin:    General: Skin is warm and dry.  Neurological:     Mental Status: She is alert.  Psychiatric:        Mood and Affect: Mood normal.        Behavior: Behavior normal.     ED Results / Procedures / Treatments   Labs (all labs ordered are listed, but only abnormal results are displayed) Labs Reviewed  URINALYSIS, ROUTINE W REFLEX MICROSCOPIC - Abnormal; Notable for the following components:      Result Value   Color, Urine ORANGE (*)    APPearance CLOUDY (*)    Glucose, UA >=500 (*)    Hgb urine dipstick LARGE (*)    Bilirubin Urine MODERATE (*)    Ketones, ur 15 (*)    Protein, ur 100 (*)    Nitrite POSITIVE (*)    Leukocytes,Ua MODERATE (*)    All other components within normal limits  URINALYSIS,  MICROSCOPIC (REFLEX) - Abnormal; Notable for the following components:   Bacteria, UA MANY (*)    Non Squamous Epithelial PRESENT (*)    All other components within normal limits  URINE CULTURE    EKG None  Radiology No results found.  Procedures Procedures (including critical care time)  Medications Ordered in ED Medications  levofloxacin (LEVAQUIN) tablet 750 mg (750 mg Oral Given 10/03/19 2220)    ED Course  I have reviewed the triage vital signs and the nursing notes.  Pertinent labs & imaging results that were  available during my care of the patient were reviewed by me and considered in my medical decision making (see chart for details).  59 yo female presenting to the ED with abdominal cramping pain and dysuria and likely UTI.  She feels this is similar to her prior UTI's, and came to the ED "as soon as the symptoms started" because she reports having developed a kidney infection in the past.  She has no flank tenderness or fever to suggest pyelonephritis at this time.  She is clinically well appearing to me.   I do not believe we need bloodwork or a CT scan at this time.  Her UA is strongly suggestive of a UA, so we will initiate antibiotics until her cultures return.  Given her prior hx of pyelo, I think it's reasonable to try levaquin for 5 days, and that the benefits outweigh the risks in her case.  She agrees with this plan.    Final Clinical Impression(s) / ED Diagnoses Final diagnoses:  Acute cystitis with hematuria    Rx / DC Orders ED Discharge Orders         Ordered    levofloxacin (LEVAQUIN) 500 MG tablet  Daily     10/03/19 2217           Wyvonnia Dusky, MD 10/04/19 1219

## 2019-10-03 NOTE — ED Notes (Signed)
Spoke with Ted in lab to add on urine culture

## 2019-10-03 NOTE — ED Notes (Signed)
Verified with Dr. Renaye Rakers that patient can go to [facility]

## 2019-10-03 NOTE — Discharge Instructions (Signed)
I am treating you for a significant urine infection and possible kidney infection with 5 days of Levaquin.  You got your first dose in the ER today on 1/3.  You should resume this antibiotic tomorrow on 1/4 and complete the full course.

## 2019-10-05 LAB — URINE CULTURE

## 2019-10-06 ENCOUNTER — Encounter (HOSPITAL_BASED_OUTPATIENT_CLINIC_OR_DEPARTMENT_OTHER): Payer: Self-pay | Admitting: Emergency Medicine

## 2019-10-06 ENCOUNTER — Emergency Department (HOSPITAL_BASED_OUTPATIENT_CLINIC_OR_DEPARTMENT_OTHER): Payer: 59

## 2019-10-06 ENCOUNTER — Other Ambulatory Visit: Payer: Self-pay

## 2019-10-06 ENCOUNTER — Emergency Department (HOSPITAL_BASED_OUTPATIENT_CLINIC_OR_DEPARTMENT_OTHER)
Admission: EM | Admit: 2019-10-06 | Discharge: 2019-10-06 | Disposition: A | Discharge status: Home / Self Care | Payer: 59 | Attending: Emergency Medicine | Admitting: Emergency Medicine

## 2019-10-06 DIAGNOSIS — R6883 Chills (without fever): Secondary | ICD-10-CM | POA: Diagnosis present

## 2019-10-06 DIAGNOSIS — J449 Chronic obstructive pulmonary disease, unspecified: Secondary | ICD-10-CM | POA: Insufficient documentation

## 2019-10-06 DIAGNOSIS — Z79899 Other long term (current) drug therapy: Secondary | ICD-10-CM | POA: Insufficient documentation

## 2019-10-06 DIAGNOSIS — Z20822 Contact with and (suspected) exposure to covid-19: Secondary | ICD-10-CM | POA: Diagnosis not present

## 2019-10-06 MED ORDER — ACETAMINOPHEN 325 MG PO TABS
650.0000 mg | ORAL_TABLET | Freq: Once | ORAL | Status: AC
  Administered 2019-10-06: 650 mg via ORAL
  Filled 2019-10-06: qty 2

## 2019-10-06 MED FILL — BUPROPION HCL XL 300 MG TAB: 300 | 30 days supply | Qty: 30 | Fill #1

## 2019-10-06 MED FILL — FLUoxetine HCL 40 MG CAPS: 40 | 30 days supply | Qty: 30 | Fill #1

## 2019-10-06 NOTE — ED Triage Notes (Signed)
Pt reports fever, loss of taste & smell, chills, sob, cough, and loss of appetite x 2 days. Pt reports recent treatment here in ED for Kidney infection. Pt reports wanting Covid test.

## 2019-10-06 NOTE — Discharge Instructions (Signed)
You were tested for COVID. Stay home per guidelines below  Stay hydrated   Take tylenol for fevers   Expect fever and chills for a week   Return to ER if you have worse shortness of breath, chest pain, fever, vomiting      Person Under Monitoring Name: Allison Le  Location: [address] [place] [address]   Infection Prevention Recommendations for Individuals Confirmed to have, or Being Evaluated for, 2019 Novel Coronavirus (COVID-19) Infection Who Receive Care at Home  Individuals who are confirmed to have, or are being evaluated for, COVID-19 should follow the prevention steps below until a healthcare provider or local or state health department says they can return to normal activities.  Stay home except to get medical care You should restrict activities outside your home, except for getting medical care. Do not go to work, school, or public areas, and do not use public transportation or taxis.  Call ahead before visiting your doctor Before your medical appointment, call the healthcare provider and tell them that you have, or are being evaluated for, COVID-19 infection. This will help the healthcare provider's office take steps to keep other people from getting infected. Ask your healthcare provider to call the local or state health department.  Monitor your symptoms Seek prompt medical attention if your illness is worsening (e.g., difficulty breathing). Before going to your medical appointment, call the healthcare provider and tell them that you have, or are being evaluated for, COVID-19 infection. Ask your healthcare provider to call the local or state health department.  Wear a facemask You should wear a facemask that covers your nose and mouth when you are in the same room with other people and when you visit a healthcare provider. People who live with or visit you should also wear a facemask while they are in the same room with you.  Separate  yourself from other people in your home As much as possible, you should stay in a different room from other people in your home. Also, you should use a separate bathroom, if available.  Avoid sharing household items You should not share dishes, drinking glasses, cups, eating utensils, towels, bedding, or other items with other people in your home. After using these items, you should wash them thoroughly with soap and water.  Cover your coughs and sneezes Cover your mouth and nose with a tissue when you cough or sneeze, or you can cough or sneeze into your sleeve. Throw used tissues in a lined trash can, and immediately wash your hands with soap and water for at least 20 seconds or use an alcohol-based hand rub.  Wash your [employer] your hands often and thoroughly with soap and water for at least 20 seconds. You can use an alcohol-based hand sanitizer if soap and water are not available and if your hands are not visibly dirty. Avoid touching your eyes, nose, and mouth with unwashed hands.   Prevention Steps for Caregivers and Household Members of Individuals Confirmed to have, or Being Evaluated for, COVID-19 Infection Being Cared for in the Home  If you live with, or provide care at home for, a person confirmed to have, or being evaluated for, COVID-19 infection please follow these guidelines to prevent infection:  Follow healthcare provider's instructions Make sure that you understand and can help the patient follow any healthcare provider instructions for all care.  Provide for the patient's basic needs You should help the patient with basic needs in the home and  provide support for getting groceries, prescriptions, and other personal needs.  Monitor the patient's symptoms If they are getting sicker, call his or her medical provider and tell them that the patient has, or is being evaluated for, COVID-19 infection. This will help the healthcare provider's office take steps to keep  other people from getting infected. Ask the healthcare provider to call the local or state health department.  Limit the number of people who have contact with the patient If possible, have only one caregiver for the patient. Other household members should stay in another home or place of residence. If this is not possible, they should stay in another room, or be separated from the patient as much as possible. Use a separate bathroom, if available. Restrict visitors who do not have an essential need to be in the home.  Keep older adults, very young children, and other sick people away from the patient Keep older adults, very young children, and those who have compromised immune systems or chronic health conditions away from the patient. This includes people with chronic heart, lung, or kidney conditions, diabetes, and cancer.  Ensure good ventilation Make sure that shared spaces in the home have good air flow, such as from an air conditioner or an opened window, weather permitting.  Wash your hands often Wash your hands often and thoroughly with soap and water for at least 20 seconds. You can use an alcohol based hand sanitizer if soap and water are not available and if your hands are not visibly dirty. Avoid touching your eyes, nose, and mouth with unwashed hands. Use disposable paper towels to dry your hands. If not available, use dedicated cloth towels and replace them when they become wet.  Wear a facemask and gloves Wear a disposable facemask at all times in the room and gloves when you touch or have contact with the patient's blood, body fluids, and/or secretions or excretions, such as sweat, saliva, sputum, nasal mucus, vomit, urine, or feces.  Ensure the mask fits over your nose and mouth tightly, and do not touch it during use. Throw out disposable facemasks and gloves after using them. Do not reuse. Wash your hands immediately after removing your facemask and gloves. If your  personal clothing becomes contaminated, carefully remove clothing and launder. Wash your hands after handling contaminated clothing. Place all used disposable facemasks, gloves, and other waste in a lined container before disposing them with other household waste. Remove gloves and wash your hands immediately after handling these items.  Do not share dishes, glasses, or other household items with the patient Avoid sharing household items. You should not share dishes, drinking glasses, cups, eating utensils, towels, bedding, or other items with a patient who is confirmed to have, or being evaluated for, COVID-19 infection. After the person uses these items, you should wash them thoroughly with soap and water.  Wash laundry thoroughly Immediately remove and wash clothes or bedding that have blood, body fluids, and/or secretions or excretions, such as sweat, saliva, sputum, nasal mucus, vomit, urine, or feces, on them. Wear gloves when handling laundry from the patient. Read and follow directions on labels of laundry or clothing items and detergent. In general, wash and dry with the warmest temperatures recommended on the label.  Clean all areas the individual has used often Clean all touchable surfaces, such as counters, tabletops, doorknobs, bathroom fixtures, toilets, phones, keyboards, tablets, and bedside tables, every day. Also, clean any surfaces that may have blood, body fluids, and/or secretions or  excretions on them. Wear gloves when cleaning surfaces the patient has come in contact with. Use a diluted bleach solution (e.g., dilute bleach with 1 part bleach and 10 parts water) or a household disinfectant with a label that says EPA-registered for coronaviruses. To make a bleach solution at home, add 1 tablespoon of bleach to 1 quart (4 cups) of water. For a larger supply, add  cup of bleach to 1 gallon (16 cups) of water. Read labels of cleaning products and follow recommendations provided on  product labels. Labels contain instructions for safe and effective use of the cleaning product including precautions you should take when applying the product, such as wearing gloves or eye protection and making sure you have good ventilation during use of the product. Remove gloves and wash hands immediately after cleaning.  Monitor yourself for signs and symptoms of illness Caregivers and household members are considered close contacts, should monitor their health, and will be asked to limit movement outside of the home to the extent possible. Follow the monitoring steps for close contacts listed on the symptom monitoring form.   ? If you have additional questions, contact your local health department or call the epidemiologist on call at [phone] (available 24/7). ? This guidance is subject to change. For the most up-to-date guidance from [facility], please refer to their website: [email or web address].pl

## 2019-10-06 NOTE — ED Notes (Signed)
Patient ambulated in room. R/A SpO2 95-99%, HR 90-98, RR 16-24.  No increased WOB or DOE noted.  Patient complaint of feeling fatigued.

## 2019-10-06 NOTE — ED Provider Notes (Signed)
[facility] EMERGENCY DEPARTMENT Provider Note   CSN: [id number] Arrival date & time: 10/06/19  1034     History Chief Complaint  Patient presents with  . COVID symptoms    Allison Le is a 59 y.o. female hx of depression, recent UTI, here presenting with chills, shortness of breath, loss of taste and smell.  Patient was seen here 2 days ago for UTI symptoms .  She had a urinalysis that is positive for UTI and her urine culture grew out multiple species .  Patient is taking Keflex as prescribed. Patient states that her dysuria has improved.  Patient states for the last 2 days she started developing loss of taste and smell.  Also diffuse chills but did not take her temperature .  Patient denies any history of lung disease. She has no known sick contacts with Covid.  The history is provided by the patient.       Past Medical History:  Diagnosis Date  . Anxiety and depression    age 64  . Colitis   . Depression   . Migraine 2016   Headaches all her life.  Diagnosed with Migraines at [facility]    Patient Active Problem List   Diagnosis Date Noted  . Chronic obstructive pulmonary disease (HCC) 02/22/2019  . Insomnia 02/22/2019  . Anxiety and depression   . Migraine 09/30/2014    Past Surgical History:  Procedure Laterality Date  . ABDOMINAL HYSTERECTOMY    . HIP SURGERY       OB History   No obstetric history on file.     History reviewed. No pertinent family history.  Social History   Tobacco Use  . Smoking status: Current Every Day Smoker    Types: Cigarettes  . Smokeless tobacco: Never Used  Substance Use Topics  . Alcohol use: Yes    Comment: occ  . Drug use: No    Home Medications Prior to Admission medications   Medication Sig Start Date End Date Taking? Authorizing Provider  Ascorbic Acid (VITAMIN C PO) Take 2 tablets by mouth daily.    [provider]  Aspirin-Acetaminophen-Caffeine (GOODY HEADACHE PO) Take 1 packet by mouth  as needed (pain,headache).    [provider]  b complex vitamins tablet Take 1 tablet by mouth daily.    [provider]  buPROPion (WELLBUTRIN XL) 300 MG 24 hr tablet Take 300 mg by mouth daily. 11/13/16   [provider]  cetirizine (ZYRTEC ALLERGY) 10 MG tablet Take 1 tablet (10 mg total) by mouth daily. Patient not taking: Reported on 02/15/2019 01/16/18   Emi Holes, PA-C  cholecalciferol (VITAMIN D3) 25 MCG (1000 UT) tablet Take 1,000 Units by mouth daily.    [provider]  clonazePAM (KLONOPIN) 0.5 MG tablet Take 0.5 mg by mouth 2 (two) times daily as needed for anxiety. 12/09/16   [provider]  dicyclomine (BENTYL) 20 MG tablet Take 1 tablet (20 mg total) by mouth 2 (two) times daily. Patient not taking: Reported on 02/15/2019 05/29/17   Kellie Shropshire, PA-C  FLUoxetine (PROZAC) 40 MG capsule Take 40 mg by mouth daily. 11/13/16   [provider]  HYDROcodone-acetaminophen (NORCO/VICODIN) 5-325 MG tablet Take 1 tablet by mouth every 4 (four) hours as needed. 03/29/19   Jacalyn Lefevre, MD  ibuprofen (ADVIL,MOTRIN) 200 MG tablet Take 400-600 mg by mouth every 6 (six) hours as needed.    [provider]  levofloxacin (LEVAQUIN) 500 MG tablet Take  1.5 tablets (750 mg total) by mouth daily for 4 days. 10/04/19 10/08/19  Terald Sleeper, MD  Multiple Vitamin (MULTIVITAMIN) capsule Take 1 capsule by mouth daily.    [provider]  naproxen (NAPROSYN) 500 MG tablet Take 1 tablet (500 mg total) by mouth 2 (two) times daily. 02/22/19   Petrucelli, Samantha R, PA-C  ondansetron (ZOFRAN ODT) 8 MG disintegrating tablet Take 1 tablet (8 mg total) by mouth every 8 (eight) hours as needed for nausea or vomiting. Patient not taking: Reported on 03/09/2017 01/28/17   Jaynie Crumble, PA-C  ondansetron (ZOFRAN) 4 MG tablet Take 1 tablet (4 mg total) by mouth daily as needed for up to 10 doses for nausea or vomiting. 09/04/19    Garnette Gunner, MD  Zinc 100 MG TABS Take 100 mg by mouth daily.    [provider]    Allergies    Morphine and related, Nitrofurantoin, and Tramadol  Review of Systems   Review of Systems  Constitutional: Positive for chills.  Respiratory: Positive for cough and shortness of breath.   All other systems reviewed and are negative.   Physical Exam Updated Vital Signs BP (!) 144/90 (BP Location: Right Arm)   Pulse 82   Temp 98.3 F (36.8 C) (Oral)   Resp 20   Ht 5\' 2"  (1.575 m)   Wt 52.2 kg   SpO2 96%   BMI 21.03 kg/m   Physical Exam Vitals and nursing note reviewed.  Constitutional:      Appearance: Normal appearance.  HENT:     Head: Normocephalic.     Nose: Nose normal.     Mouth/Throat:     Mouth: Mucous membranes are moist.  Eyes:     Pupils: Pupils are equal, round, and reactive to light.  Cardiovascular:     Rate and Rhythm: Normal rate and regular rhythm.     Pulses: Normal pulses.     Heart sounds: Normal heart sounds.  Pulmonary:     Effort: Pulmonary effort is normal.     Breath sounds: Normal breath sounds.  Abdominal:     General: Abdomen is flat.     Palpations: Abdomen is soft.  Musculoskeletal:        General: Normal range of motion.     Cervical back: Normal range of motion.  Skin:    General: Skin is warm.     Capillary Refill: Capillary refill takes less than 2 seconds.  Neurological:     General: No focal deficit present.     Mental Status: She is alert and oriented to person, place, and time.  Psychiatric:        Mood and Affect: Mood normal.        Behavior: Behavior normal.     ED Results / Procedures / Treatments   Labs (all labs ordered are listed, but only abnormal results are displayed) Labs Reviewed  NOVEL CORONAVIRUS, NAA (HOSP ORDER, SEND-OUT TO REF LAB; TAT 18-24 HRS)    EKG None  Radiology DG Chest Portable 1 View  Result Date: 10/06/2019 CLINICAL DATA:  Cough, shortness of breath. Additional history  provided: Patient reports fever, loss of taste and smell, chills, shortness of breath, cough and loss of appetite for 2 days. Additional history provided: Recent treatment for kidney infection. EXAM: PORTABLE CHEST 1 VIEW COMPARISON:  Chest radiograph 01/16/2018 FINDINGS: Heart size within normal limits. There is no evidence of airspace consolidation. No evidence of pleural effusion or pneumothorax. No acute bony  abnormality. IMPRESSION: No evidence of airspace consolidation. Electronically Signed   By: Kellie Simmering DO   On: 10/06/2019 13:01    Procedures Procedures (including critical care time)  Medications Ordered in ED Medications  acetaminophen (TYLENOL) tablet 650 mg (has no administration in time range)    ED Course  I have reviewed the triage vital signs and the nursing notes.  Pertinent labs & imaging results that were available during my care of the patient were reviewed by me and considered in my medical decision making (see chart for details).    MDM Rules/Calculators/A&P                      Nysia Dell is a 59 y.o. female here with possible Covid-like symptoms.  Patient was recently seen in the ED for UTI. Her urine culture grew out multiple species but her symptoms have improved.   Patient is afebrile in the ED and not hypoxic. Her CXR is clear. She is well appearing. Concerned for possible COVID exposure while in the ED. Will send off outpatient COVID, told her to stay home as per guidelines. Doesn't meet criteria for admission for COVID currently. Told her to get pulse ox and if less than 90% consistently, to return for evaluation   Allison Le was evaluated in Emergency Department on 10/06/2019 for the symptoms described in the history of present illness. She was evaluated in the context of the global COVID-19 pandemic, which necessitated consideration that the patient might be at risk for infection with the SARS-CoV-2 virus that causes COVID-19. Institutional protocols  and algorithms that pertain to the evaluation of patients at risk for COVID-19 are in a state of rapid change based on information released by regulatory bodies including the CDC and federal and state organizations. These policies and algorithms were followed during the patient's care in the ED.   Final Clinical Impression(s) / ED Diagnoses Final diagnoses:  Encounter for screening laboratory testing for COVID-19 virus  Chills    Rx / DC Orders ED Discharge Orders    None       Drenda Freeze, MD 10/06/19 1343

## 2019-10-07 ENCOUNTER — Encounter: Payer: Self-pay | Admitting: Internal Medicine

## 2019-10-07 LAB — NOVEL CORONAVIRUS, NAA (HOSP ORDER, SEND-OUT TO REF LAB; TAT 18-24 HRS): SARS-CoV-2, NAA: NOT DETECTED

## 2019-10-08 ENCOUNTER — Encounter: Payer: Self-pay | Admitting: Internal Medicine

## 2019-10-20 MED FILL — BUPROPION HCL ER (XL) 300 M: 300 | 30 days supply | Qty: 30 | Fill #0

## 2019-10-20 MED FILL — FLUoxetine HCL 40 MG CAPS: 40 | 30 days supply | Qty: 30 | Fill #0

## 2019-10-20 MED FILL — clonazePAM 0.5 MG TABS: 0.5 | 30 days supply | Qty: 30 | Fill #0

## 2019-11-10 MED FILL — BUPROPION HCL XL 300 MG TAB: 300 | 30 days supply | Qty: 30 | Fill #0

## 2020-03-27 DIAGNOSIS — S060XAA Concussion with loss of consciousness status unknown, initial encounter: Secondary | ICD-10-CM

## 2020-03-27 DIAGNOSIS — S060X9A Concussion with loss of consciousness of unspecified duration, initial encounter: Secondary | ICD-10-CM | POA: Insufficient documentation

## 2020-03-27 HISTORY — DX: Concussion with loss of consciousness of unspecified duration, initial encounter: S06.0X9A

## 2020-03-27 HISTORY — DX: Concussion with loss of consciousness status unknown, initial encounter: S06.0XAA

## 2020-03-28 ENCOUNTER — Emergency Department (HOSPITAL_BASED_OUTPATIENT_CLINIC_OR_DEPARTMENT_OTHER)
Admission: EM | Admit: 2020-03-28 | Discharge: 2020-03-28 | Disposition: A | Discharge status: Home / Self Care | Payer: 59 | Attending: Emergency Medicine | Admitting: Emergency Medicine

## 2020-03-28 ENCOUNTER — Encounter (HOSPITAL_BASED_OUTPATIENT_CLINIC_OR_DEPARTMENT_OTHER): Payer: Self-pay | Admitting: *Deleted

## 2020-03-28 ENCOUNTER — Other Ambulatory Visit: Payer: Self-pay

## 2020-03-28 ENCOUNTER — Emergency Department (HOSPITAL_BASED_OUTPATIENT_CLINIC_OR_DEPARTMENT_OTHER): Payer: 59

## 2020-03-28 DIAGNOSIS — M25551 Pain in right hip: Secondary | ICD-10-CM | POA: Insufficient documentation

## 2020-03-28 DIAGNOSIS — F17210 Nicotine dependence, cigarettes, uncomplicated: Secondary | ICD-10-CM | POA: Diagnosis not present

## 2020-03-28 MED ORDER — NAPROXEN 375 MG PO TABS: 375.0000 mg | ORAL_TABLET | Freq: Two times a day (BID) | ORAL | 0 refills | Status: AC

## 2020-03-28 MED ORDER — METHOCARBAMOL 500 MG PO TABS: 500.0000 mg | ORAL_TABLET | Freq: Two times a day (BID) | ORAL | 0 refills | Status: AC

## 2020-03-28 NOTE — Discharge Instructions (Addendum)
I have prescribed muscle relaxers for your pain, please do not drink or drive while taking this medications as they can make you drowsy.     If you experience any bowel or bladder incontinence, fever, worsening in your symptoms please return to the ED.  
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
## 2020-04-27 DIAGNOSIS — R5382 Chronic fatigue, unspecified: Secondary | ICD-10-CM

## 2020-04-27 HISTORY — DX: Chronic fatigue, unspecified: R53.82

## 2020-05-23 ENCOUNTER — Encounter: Payer: Self-pay | Admitting: Neurology
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
## 2020-06-02 ENCOUNTER — Other Ambulatory Visit: Payer: Self-pay

## 2020-06-02 ENCOUNTER — Ambulatory Visit (INDEPENDENT_AMBULATORY_CARE_PROVIDER_SITE_OTHER): Payer: 59 | Admitting: Neurology

## 2020-06-02 ENCOUNTER — Encounter: Payer: Self-pay | Admitting: Neurology

## 2020-06-02 VITALS — BP 118/79 | HR 99 | Ht 62.0 in | Wt 115.2 lb

## 2020-06-02 DIAGNOSIS — F419 Anxiety disorder, unspecified: Secondary | ICD-10-CM

## 2020-06-02 DIAGNOSIS — F0781 Postconcussional syndrome: Secondary | ICD-10-CM

## 2020-06-02 DIAGNOSIS — R413 Other amnesia: Secondary | ICD-10-CM

## 2020-06-02 DIAGNOSIS — R42 Dizziness and giddiness: Secondary | ICD-10-CM

## 2020-06-02 DIAGNOSIS — R519 Headache, unspecified: Secondary | ICD-10-CM

## 2020-06-02 DIAGNOSIS — G4486 Cervicogenic headache: Secondary | ICD-10-CM

## 2020-06-02 DIAGNOSIS — F329 Major depressive disorder, single episode, unspecified: Secondary | ICD-10-CM

## 2020-06-02 DIAGNOSIS — F32A Depression, unspecified: Secondary | ICD-10-CM

## 2020-06-02 MED ORDER — GABAPENTIN 100 MG PO CAPS: ORAL_CAPSULE | ORAL | 0 refills | Status: DC

## 2020-06-02 NOTE — Patient Instructions (Addendum)
1.  Start gabapentin 100mg  at bedtime for one week, then 200mg  at bedtime for one week, then 300mg  at bedtime. 2.  Limit use of pain relievers (Goody, ibuprofen, Aleve, Advil) to no more than 2 days out of week to prevent risk of rebound or medication-overuse headache.  Do not use hydrocodone to treat headache.  3.  In addition to this I recommend......  To help reduce HEADACHES: Coenzyme Q10 160mg  ONCE DAILY Riboflavin/Vitamin B2 400mg  ONCE DAILY Magnesium oxide 400mg  ONCE - TWICE DAILY May stop after headaches are resolved.                                                                                               To help with INSOMNIA: Melatonin 3-5mg  AT BEDTIME     Other medicines to help decrease inflammation Alpha Lipoic Acid 100mg  TWICE DAILY Turmeric 500mg  twice daily  4.  Will check MRI of brain without contrast. We have sent a referral to [facility] for your MRI and they will call you directly to schedule your appointment. They are located at [address] [facility]. If you need to contact them directly please call [phone].  5.  Continue with the chiropractor.  If you wish to see a physical therapist, contact my office. 6.  Follow up in 4 months.

## 2020-06-06 DIAGNOSIS — M1731 Unilateral post-traumatic osteoarthritis, right knee: Secondary | ICD-10-CM

## 2020-06-06 HISTORY — DX: Unilateral post-traumatic osteoarthritis, right knee: M17.31

## 2020-06-28 ENCOUNTER — Ambulatory Visit
Admission: RE | Admit: 2020-06-28 | Discharge: 2020-06-28 | Disposition: A | Discharge status: Home / Self Care | Payer: 59 | Source: Ambulatory Visit | Attending: Neurology | Admitting: Neurology

## 2020-06-28 ENCOUNTER — Other Ambulatory Visit: Payer: Self-pay

## 2020-06-28 DIAGNOSIS — R413 Other amnesia: Secondary | ICD-10-CM

## 2020-06-28 DIAGNOSIS — F0781 Postconcussional syndrome: Secondary | ICD-10-CM

## 2020-06-28 DIAGNOSIS — R42 Dizziness and giddiness: Secondary | ICD-10-CM

## 2020-06-28 DIAGNOSIS — G4486 Cervicogenic headache: Secondary | ICD-10-CM

## 2020-07-06 ENCOUNTER — Other Ambulatory Visit: Payer: Self-pay | Admitting: Neurology

## 2020-07-18 ENCOUNTER — Telehealth: Payer: Self-pay | Admitting: Neurology

## 2020-07-18 NOTE — Telephone Encounter (Signed)
Patient called and said, "I think I need a sooner appointment with Dr. Everlena Cooper. I'm having some issues related to the concussion. I'd also like to review my medication and discuss the MRI."  Patient is scheduled 10/24/2020 with Dr. Everlena Cooper. Please advise?

## 2020-07-18 NOTE — Telephone Encounter (Signed)
She is free to make an appointment

## 2020-07-18 NOTE — Telephone Encounter (Signed)
Having slurred speech more recently, doesn't want to wait till jan. Personally wants to review MRI report in office with you. Please advise.

## 2020-07-19 NOTE — Telephone Encounter (Signed)
Patient added to wait list

## 2020-07-19 NOTE — Telephone Encounter (Signed)
Called patient and left a message to call back to schedule follow up with Dr. Everlena Cooper.

## 2020-07-24 ENCOUNTER — Encounter: Payer: Self-pay | Admitting: Neurology

## 2020-07-24 ENCOUNTER — Ambulatory Visit (INDEPENDENT_AMBULATORY_CARE_PROVIDER_SITE_OTHER): Payer: 59 | Admitting: Neurology

## 2020-07-24 ENCOUNTER — Other Ambulatory Visit: Payer: Self-pay

## 2020-07-24 VITALS — BP 120/82 | HR 74 | Resp 20 | Ht 62.0 in | Wt 115.0 lb

## 2020-07-24 DIAGNOSIS — F0781 Postconcussional syndrome: Secondary | ICD-10-CM | POA: Diagnosis not present

## 2020-07-24 DIAGNOSIS — R42 Dizziness and giddiness: Secondary | ICD-10-CM | POA: Diagnosis not present

## 2020-07-24 MED ORDER — GABAPENTIN 300 MG PO CAPS: 300.0000 mg | ORAL_CAPSULE | Freq: Every day | ORAL | 5 refills | Status: DC
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
## 2020-07-24 NOTE — Patient Instructions (Addendum)
1.  Continue gabapentin 300mg  at bedtime 2.  I will refer you to physical therapy for vestibular rehabilitation to help with dizziness 3.  Keep appointment in January.

## 2020-07-25 ENCOUNTER — Other Ambulatory Visit: Payer: Self-pay | Admitting: Neurology

## 2020-08-17 DIAGNOSIS — F0781 Postconcussional syndrome: Secondary | ICD-10-CM

## 2020-08-17 HISTORY — DX: Postconcussional syndrome: F07.81

## 2020-08-29 ENCOUNTER — Other Ambulatory Visit: Payer: Self-pay | Admitting: Neurology

## 2020-08-30 ENCOUNTER — Ambulatory Visit: Payer: 59 | Attending: Neurology | Admitting: Physical Therapy

## 2020-08-30 ENCOUNTER — Ambulatory Visit: Payer: 59 | Admitting: Physical Therapy

## 2020-08-30 ENCOUNTER — Other Ambulatory Visit: Payer: Self-pay

## 2020-08-30 ENCOUNTER — Encounter: Payer: Self-pay | Admitting: Physical Therapy

## 2020-08-30 DIAGNOSIS — R42 Dizziness and giddiness: Secondary | ICD-10-CM | POA: Insufficient documentation

## 2020-08-30 DIAGNOSIS — R2689 Other abnormalities of gait and mobility: Secondary | ICD-10-CM | POA: Insufficient documentation

## 2020-08-30 DIAGNOSIS — R2681 Unsteadiness on feet: Secondary | ICD-10-CM | POA: Insufficient documentation

## 2020-08-30 DIAGNOSIS — R262 Difficulty in walking, not elsewhere classified: Secondary | ICD-10-CM

## 2020-08-30 DIAGNOSIS — F0781 Postconcussional syndrome: Secondary | ICD-10-CM | POA: Diagnosis present
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
## 2020-09-04 DIAGNOSIS — F4310 Post-traumatic stress disorder, unspecified: Secondary | ICD-10-CM | POA: Insufficient documentation

## 2020-09-04 DIAGNOSIS — S83242A Other tear of medial meniscus, current injury, left knee, initial encounter: Secondary | ICD-10-CM

## 2020-09-04 HISTORY — DX: Post-traumatic stress disorder, unspecified: F43.10

## 2020-09-04 HISTORY — DX: Other tear of medial meniscus, current injury, left knee, initial encounter: S83.242A

## 2020-09-06 ENCOUNTER — Other Ambulatory Visit: Payer: Self-pay

## 2020-09-06 ENCOUNTER — Ambulatory Visit: Payer: 59 | Admitting: Physical Therapy

## 2020-09-06 DIAGNOSIS — R2689 Other abnormalities of gait and mobility: Secondary | ICD-10-CM

## 2020-09-06 DIAGNOSIS — R262 Difficulty in walking, not elsewhere classified: Secondary | ICD-10-CM

## 2020-09-06 DIAGNOSIS — F0781 Postconcussional syndrome: Secondary | ICD-10-CM

## 2020-09-06 DIAGNOSIS — R42 Dizziness and giddiness: Secondary | ICD-10-CM

## 2020-09-06 DIAGNOSIS — R2681 Unsteadiness on feet: Secondary | ICD-10-CM

## 2020-09-06 NOTE — Therapy (Signed)
[facility] [address] [place], [place], [address] Phone: [phone]   Fax:  [phone]  Physical Therapy Treatment  Patient Details  Name: Allison Le MRN: [id number] Date of Birth: June 14, 1961 Referring Provider (PT): Drema Dallas, DO   Encounter Date: 09/06/2020   PT End of Session - 09/06/20 1738    Visit Number 2    Number of Visits 10    Date for PT Re-Evaluation 10/11/20    Authorization Type [employer]    PT Start Time 1530    PT Stop Time 1615    PT Time Calculation (min) 45 min    Equipment Utilized During Treatment Gait belt    Activity Tolerance Patient limited by fatigue    Behavior During Therapy [facility] for tasks assessed/performed;Anxious           Past Medical History:  Diagnosis Date  . Anxiety and depression    age 16  . Colitis   . Depression   . Migraine 2016   Headaches all her life.  Diagnosed with Migraines at [facility]    Past Surgical History:  Procedure Laterality Date  . ABDOMINAL HYSTERECTOMY    . HIP SURGERY      There were no vitals filed for this visit.   Subjective Assessment - 09/06/20 1538    Subjective Pt states that nothing significant has happened. Pt reports L knee was swelling today so she's wearing a brace. Pt does report a vertiginous attack while walking in [employer] -- pt reports she was walking down the aisle and looking up/down at rugs when she felt spinning. This alleviated with rest.    Pertinent History anxiety    Limitations Lifting;Walking;Standing;House hold activities    How long can you sit comfortably? n/a    How long can you stand comfortably? n/a    How long can you walk comfortably? n/a    Patient Stated Goals Improve dizziness and balance; safe return to work and the gym    Currently in Pain? No/denies    Pain Onset More than a month ago                              Vestibular Treatment/Exercise - 09/06/20 0001       Vestibular Treatment/Exercise   Habituation Exercises Standing Horizontal Head Turns;Standing Vertical Head Turns    Gaze Exercises Eye/Head Exercise Horizontal;Eye/Head Exercise Vertical;X1 Viewing Horizontal;X1 Viewing Vertical      X1 Viewing Horizontal   Foot Position Standing feet apart solid surface    Reps 10      X1 Viewing Vertical   Foot Position Standing feet apart solid surface    Reps 10      Eye/Head Exercise Horizontal   Foot Position Feet together on solid surface    Comments 2x30 sec saccades & then smooth pursuit      Eye/Head Exercise Vertical   Foot Position Feet together on solid surface    Comments 2x30 sec saccades & then smooth pursuit              Balance Exercises - 09/06/20 0001      Balance Exercises: Standing   Standing Eyes Opened Wide (BOA);Solid surface;Head turns   2x10 reps each   Standing Eyes Closed Wide (BOA);Solid surface;Head turns;30 secs;Narrow base of support (BOS)   head nods & rotation x30 sec each  PT Education - 09/06/20 1611    Education Details Discussed meditation, deep breathing, yoga and stretching to help with her symptoms. Discussed HEP.    Person(s) Educated Patient    Methods Explanation;Demonstration;Verbal cues;Handout    Comprehension Verbalized understanding;Returned demonstration;Verbal cues required            PT Short Term Goals - 08/30/20 1732      PT SHORT TERM GOAL #1   Title Pt will be independent with initial HEP    Time 3    Period Weeks    Status New    Target Date 09/20/20      PT SHORT TERM GOAL #2   Title Pt will be able to return to work with subjective report of <50% of current symptoms    Baseline Unable to work    Time 3    Period Weeks    Status New    Target Date 09/20/20      PT SHORT TERM GOAL #3   Title Pt will have improved gait speed to at least 3 ft/sec to be within age norms    Baseline 1.98 ft/sec    Time 3    Period Weeks    Status New    Target  Date 09/20/20             PT Long Term Goals - 08/30/20 1733      PT LONG TERM GOAL #1   Title Pt will be independent with advanced HEP    Time 6    Period Weeks    Status New    Target Date 10/11/20      PT LONG TERM GOAL #2   Title Pt will improve DGI to at least 22/24 to demo reduced fall risk    Baseline 12/24 on eval    Time 6    Period Weeks    Status New    Target Date 10/11/20      PT LONG TERM GOAL #3   Title Pt will have improved DHI score to </=30 to demo mild impairment    Baseline 60/100 rated as severe    Time 6    Period Weeks    Status New    Target Date 10/11/20      PT LONG TERM GOAL #4   Title Pt will have improved FOTO score to 72%    Baseline 54% on eval    Time 6    Period Weeks    Status New    Target Date 10/11/20                 Plan - 09/06/20 1621    Clinical Impression Statement Treatment focused on initiating a vestibular and balance HEP for pt. Pt with decreased speed with L gaze vs R gaze. Most symptomatic with VOR x1 and smooth pursuit. Pt required multiple rest breaks to decrease symptoms. Increased time spent discussing self-care and providing education about anxiety/fear increasing her dizziness -- pt amenable to trying chair yoga.    Personal Factors and Comorbidities Comorbidity 1;Comorbidity 2;Time since onset of injury/illness/exacerbation    Comorbidities anxiety, post concussion syndrome    Examination-Activity Limitations Caring for Others;Bend;Hygiene/Grooming;Locomotion Level;Squat;Stairs;Stand;Toileting;Transfers;Bed Mobility    Examination-Participation Restrictions Cleaning;Community Activity;Occupation;Yard Work;Meal Prep    Stability/Clinical Decision Making Evolving/Moderate complexity    Rehab Potential Good    PT Frequency Other (comment)   2x/wk for 3 wks; transition 1x/wk for 3 more wks   PT Duration Other (comment)  PT Treatment/Interventions ADLs/Self Care Home Management;Canalith Repostioning;Gait  training;Stair training;Functional mobility training;Therapeutic activities;Therapeutic exercise;Balance training;Neuromuscular re-education;Patient/family education;Manual techniques;Passive range of motion;Energy conservation;Taping;Vestibular    PT Next Visit Plan Assess strength and Berg Balance. Initiate balance HEP with focus on vestibular integration and gaze stabilization.    Consulted and Agree with Plan of Care Patient           Patient will benefit from skilled therapeutic intervention in order to improve the following deficits and impairments:  Abnormal gait, Difficulty walking, Dizziness, Decreased activity tolerance, Decreased balance, Decreased mobility, Decreased strength  Visit Diagnosis: Dizziness and giddiness  Post concussion syndrome  Difficulty in walking, not elsewhere classified  Unsteadiness on feet  Other abnormalities of gait and mobility     Problem List Patient Active Problem List   Diagnosis Date Noted  . Chronic obstructive pulmonary disease (HCC) 02/22/2019  . Insomnia 02/22/2019  . Anxiety and depression   . Migraine 09/30/2014    Rontrell Moquin April Ma L Vora Clover PT, DPT 09/06/2020, 5:41 PM  [facility] [address] [place], [place], [address] Phone: [phone]   Fax:  [phone]  Name: Allison Le MRN: [id number] Date of Birth: 11/15/1960

## 2020-09-07 ENCOUNTER — Ambulatory Visit: Payer: 59

## 2020-09-13 ENCOUNTER — Other Ambulatory Visit: Payer: Self-pay

## 2020-09-13 ENCOUNTER — Ambulatory Visit: Payer: 59

## 2020-09-13 DIAGNOSIS — R42 Dizziness and giddiness: Secondary | ICD-10-CM

## 2020-09-13 DIAGNOSIS — R2689 Other abnormalities of gait and mobility: Secondary | ICD-10-CM

## 2020-09-13 DIAGNOSIS — R262 Difficulty in walking, not elsewhere classified: Secondary | ICD-10-CM

## 2020-09-13 DIAGNOSIS — R2681 Unsteadiness on feet: Secondary | ICD-10-CM

## 2020-09-13 NOTE — Therapy (Signed)
[facility] [address] [place], [place], [address] Phone: [phone]   Fax:  [phone]  Physical Therapy Treatment  Patient Details  Name: Arely Tinner MRN: [id number] Date of Birth: September 07, 1961 Referring Provider (PT): Drema Dallas, DO   Encounter Date: 09/13/2020   PT End of Session - 09/13/20 1326    Visit Number 3    Number of Visits 10    Date for PT Re-Evaluation 10/11/20    Authorization Type [employer]    PT Start Time 1318    PT Stop Time 1400    PT Time Calculation (min) 42 min    Equipment Utilized During Treatment Gait belt    Activity Tolerance Patient limited by fatigue    Behavior During Therapy [facility] for tasks assessed/performed;Anxious           Past Medical History:  Diagnosis Date  . Anxiety and depression    age 59  . Colitis   . Depression   . Migraine 2016   Headaches all her life.  Diagnosed with Migraines at [facility]    Past Surgical History:  Procedure Laterality Date  . ABDOMINAL HYSTERECTOMY    . HIP SURGERY      There were no vitals filed for this visit.   Subjective Assessment - 09/13/20 1321    Subjective Patient reports that she has not had a good day today, yesterday and today has been two of the worst days she has. Patient reports that she continues to wear the knee brace due to L knee swelling. Patient reports that she has been having numbness/tinglign sensation in L arm 1-2x/week. Patient reports mild dizziness prior to session 2/10.    Pertinent History anxiety    Limitations Lifting;Walking;Standing;House hold activities    How long can you sit comfortably? n/a    How long can you stand comfortably? n/a    How long can you walk comfortably? n/a    Patient Stated Goals Improve dizziness and balance; safe return to work and the gym    Currently in Pain? Yes    Pain Score 5     Pain Location Neck    Pain Orientation Lower    Pain Descriptors / Indicators  Aching    Pain Type Chronic pain    Pain Onset More than a month ago    Pain Frequency Constant    Multiple Pain Sites No              [facility] PT Assessment - 09/13/20 1459      Palpation   Palpation comment Increased muscle tension noted to L Upper Trap, L Levator Scap, Rhomboids, and B Suboccipitals.            [facility] Adult PT Treatment/Exercise - 09/13/20 0001      Ambulation/Gait   Ambulation/Gait Yes    Ambulation/Gait Assistance 6: Modified independent (Device/Increase time)    Ambulation Distance (Feet) --   clinic distance   Assistive device None    Gait Pattern Step-through pattern;Decreased stride length;Shuffle;Right foot flat;Left foot flat    Ambulation Surface Level;Indoor    Gait Comments into/out of therapy session, with knee brace donned      Self-Care   Self-Care Other Self-Care Comments    Other Self-Care Comments  PT educating on heat application to L Upper trap and cervical spine to promote reduction in tension. PT also educating on monitoring symptoms with 0-10 scale when completing vestibular exrecises to continously monitor symptoms.  Patient also requesting out of work note, PT educating that reach out to Neurologist (Dr. Everlena Cooper, referring doctor) to obtain out of work note.      Manual Therapy   Manual Therapy Soft tissue mobilization    Soft tissue mobilization PT completed soft tissue mobilization to L Upper Trap, L Rhomboid, L Levator Scapular, and L Suboccipital. Multiple triggers points noted in area,s with PT providing extended pressure to area to promote relaxation. Completed STM x 14 minutes. Patient reporting improvements in pain and tightness upon sitting. With palpation to suboccipital increase in HA symptoms noted, but no HA reported after completion of manual therapy.           Vestibular Treatment/Exercise - 09/13/20 0001      Vestibular Treatment/Exercise   Gaze Exercises X1 Viewing Horizontal;X1 Viewing Vertical      X1 Viewing  Horizontal   Foot Position Seated    Reps 2    Comments x 30 seconds. Reports increased symptoms, blurred vision. Reports difficulty with maintaining focus with completion      X1 Viewing Vertical   Foot Position Seated    Reps 1    Comments x 20 secs, unable to adequately maintain gaze during completion. Reports increased symptoms, blurred vision.                 PT Education - 09/13/20 1452    Education Details Potential for Dry Needling to address Trigger Points    Person(s) Educated Patient    Methods Explanation    Comprehension Verbalized understanding            PT Short Term Goals - 08/30/20 1732      PT SHORT TERM GOAL #1   Title Pt will be independent with initial HEP    Time 3    Period Weeks    Status New    Target Date 09/20/20      PT SHORT TERM GOAL #2   Title Pt will be able to return to work with subjective report of <50% of current symptoms    Baseline Unable to work    Time 3    Period Weeks    Status New    Target Date 09/20/20      PT SHORT TERM GOAL #3   Title Pt will have improved gait speed to at least 3 ft/sec to be within age norms    Baseline 1.98 ft/sec    Time 3    Period Weeks    Status New    Target Date 09/20/20             PT Long Term Goals - 08/30/20 1733      PT LONG TERM GOAL #1   Title Pt will be independent with advanced HEP    Time 6    Period Weeks    Status New    Target Date 10/11/20      PT LONG TERM GOAL #2   Title Pt will improve DGI to at least 22/24 to demo reduced fall risk    Baseline 12/24 on eval    Time 6    Period Weeks    Status New    Target Date 10/11/20      PT LONG TERM GOAL #3   Title Pt will have improved DHI score to </=30 to demo mild impairment    Baseline 60/100 rated as severe    Time 6    Period Weeks    Status New  Target Date 10/11/20      PT LONG TERM GOAL #4   Title Pt will have improved FOTO score to 72%    Baseline 54% on eval    Time 6    Period Weeks     Status New    Target Date 10/11/20                 Plan - 09/13/20 1504    Clinical Impression Statement Today's skilled PT session included STM and trigger point release to L cervical musculature and L Upper Trap. Patient reporting reduction in tension and pain after completion of manual therapy. Continued gaze stabilization exercises as tolerated by patient, continue to require rest breaks to resolve symptoms. Will continue to progress toward all goals.    Personal Factors and Comorbidities Comorbidity 1;Comorbidity 2;Time since onset of injury/illness/exacerbation    Comorbidities anxiety, post concussion syndrome    Examination-Activity Limitations Caring for Others;Bend;Hygiene/Grooming;Locomotion Level;Squat;Stairs;Stand;Toileting;Transfers;Bed Mobility    Examination-Participation Restrictions Cleaning;Community Activity;Occupation;Yard Work;Meal Prep    Stability/Clinical Decision Making Evolving/Moderate complexity    Rehab Potential Good    PT Frequency Other (comment)   2x/wk for 3 wks; transition 1x/wk for 3 more wks   PT Duration Other (comment)    PT Treatment/Interventions ADLs/Self Care Home Management;Canalith Repostioning;Gait training;Stair training;Functional mobility training;Therapeutic activities;Therapeutic exercise;Balance training;Neuromuscular re-education;Patient/family education;Manual techniques;Passive range of motion;Energy conservation;Taping;Vestibular    PT Next Visit Plan How was heat application? Continue manual therapy to upper cervical spine. Continue gaze stabilization/saccade. Add stretches for neck to HEP    Consulted and Agree with Plan of Care Patient           Patient will benefit from skilled therapeutic intervention in order to improve the following deficits and impairments:  Abnormal gait,Difficulty walking,Dizziness,Decreased activity tolerance,Decreased balance,Decreased mobility,Decreased strength  Visit Diagnosis: Dizziness and  giddiness  Difficulty in walking, not elsewhere classified  Unsteadiness on feet  Other abnormalities of gait and mobility     Problem List Patient Active Problem List   Diagnosis Date Noted  . Chronic obstructive pulmonary disease (HCC) 02/22/2019  . Insomnia 02/22/2019  . Anxiety and depression   . Migraine 09/30/2014    Tempie Donning, PT, DPT 09/13/2020, 3:08 PM  [facility] [address] [place], [place], [address] Phone: [phone]   Fax:  [phone]  Name: Shaley Leavens MRN: [id number] Date of Birth: [birth year]

## 2020-09-14 ENCOUNTER — Ambulatory Visit: Payer: 59 | Admitting: Physical Therapy

## 2020-09-20 ENCOUNTER — Ambulatory Visit: Payer: 59 | Admitting: Physical Therapy

## 2020-09-20 ENCOUNTER — Telehealth: Payer: Self-pay | Admitting: Neurology

## 2020-09-20 ENCOUNTER — Other Ambulatory Visit: Payer: Self-pay

## 2020-09-20 DIAGNOSIS — R2689 Other abnormalities of gait and mobility: Secondary | ICD-10-CM

## 2020-09-20 DIAGNOSIS — R42 Dizziness and giddiness: Secondary | ICD-10-CM | POA: Diagnosis not present

## 2020-09-20 DIAGNOSIS — F0781 Postconcussional syndrome: Secondary | ICD-10-CM

## 2020-09-20 DIAGNOSIS — R2681 Unsteadiness on feet: Secondary | ICD-10-CM

## 2020-09-20 DIAGNOSIS — R262 Difficulty in walking, not elsewhere classified: Secondary | ICD-10-CM

## 2020-09-20 NOTE — Telephone Encounter (Signed)
Patient added to wait list
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
## 2020-09-20 NOTE — Telephone Encounter (Signed)
Returned pt call, Pt had a refill of Gabapentin sent in on 10/25 with 5 refills. [facility] at the [facility].     Advised pt that she may want to call the [facility] to see why she is unable to pick up. We can ask the front desk to add you to the wait list.  Ask pt if she was taking more then prescribed. Pt did advise she took two to three pills the other day due to the pain from her headache.  Advised pt that she needs to take the medication as prescribed for right now until her f/u appt or till DR.Everlena Cooper tells her other wise.   Dr.Jaffe please advise what the pt can do til her appt 10/24/20.    Front desk can you add the pt to wait list?

## 2020-09-21 ENCOUNTER — Ambulatory Visit: Payer: 59 | Admitting: Physical Therapy

## 2020-09-21 ENCOUNTER — Other Ambulatory Visit: Payer: Self-pay | Admitting: Neurology

## 2020-09-21 MED ORDER — MECLIZINE HCL 25 MG PO TABS: 25.0000 mg | ORAL_TABLET | Freq: Three times a day (TID) | ORAL | 5 refills | Status: DC | PRN

## 2020-09-21 MED ORDER — GABAPENTIN 100 MG PO CAPS: ORAL_CAPSULE | ORAL | 0 refills | Status: DC

## 2020-09-21 NOTE — Telephone Encounter (Signed)
I sent to [facility] to [facility]:  I want to increase gabapentin.  She will continue the 300mg  capsule at bedtime.  I sent in the prescription for 100mg  capsule - she will take 1 capsule every morning for a week, then 2 capsules every morning for a week, then 3 capsules every morning until she is taking a goal of 300mg  twice daily For dizziness, I sent in a prescription for meclizine as needed.  However, she needs to take this sparingly and only if truly needed.  I have given her only 30 tablets so she shouldn't be taking it around the clock.

## 2020-09-21 NOTE — Telephone Encounter (Signed)
LMOVM, For pt to call back.

## 2020-09-21 NOTE — Telephone Encounter (Signed)
Pt advised of Dr.Jaffe message.

## 2020-09-24 DIAGNOSIS — M1712 Unilateral primary osteoarthritis, left knee: Secondary | ICD-10-CM | POA: Insufficient documentation
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
## 2020-09-25 ENCOUNTER — Ambulatory Visit: Payer: 59 | Admitting: Neurology

## 2020-09-27 ENCOUNTER — Ambulatory Visit: Payer: 59 | Admitting: Physical Therapy
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
## 2020-10-03 ENCOUNTER — Ambulatory Visit (INDEPENDENT_AMBULATORY_CARE_PROVIDER_SITE_OTHER): Payer: 59 | Admitting: Neurology

## 2020-10-03 ENCOUNTER — Other Ambulatory Visit: Payer: Self-pay

## 2020-10-03 ENCOUNTER — Encounter: Payer: Self-pay | Admitting: Neurology

## 2020-10-03 VITALS — BP 118/65 | HR 82 | Resp 18 | Ht 62.0 in | Wt 113.0 lb

## 2020-10-03 DIAGNOSIS — R413 Other amnesia: Secondary | ICD-10-CM | POA: Diagnosis not present

## 2020-10-03 DIAGNOSIS — R42 Dizziness and giddiness: Secondary | ICD-10-CM | POA: Diagnosis not present

## 2020-10-03 DIAGNOSIS — G44319 Acute post-traumatic headache, not intractable: Secondary | ICD-10-CM

## 2020-10-03 DIAGNOSIS — F4310 Post-traumatic stress disorder, unspecified: Secondary | ICD-10-CM

## 2020-10-03 DIAGNOSIS — F0781 Postconcussional syndrome: Secondary | ICD-10-CM | POA: Diagnosis not present

## 2020-10-03 MED ORDER — GABAPENTIN 100 MG PO CAPS: ORAL_CAPSULE | ORAL | 0 refills | Status: DC

## 2020-10-03 NOTE — Patient Instructions (Signed)
1.  Start gabapentin 100mg  pill.  Take 1 pill in morning for one week, then 2 pills in morning for one week, then 3 pills in the morning.  This is to be taken with the 300mg  capsule at bedtime (goal is 300mg  twice daily)  2.  To help improve COGNITIVE function: Using fish oil/omega 3 that is 1000 mg (or roughly 600 mg EPA/DHA), starting as soon as possible after concussion, take: 3 tabs THREE TIMES a day  for the first 3 days, then (you will smell a little, sorry) 3 tabs TWICE DAILY  for the next 3 days, then 3 tabs ONCE DAILY  for the next 10 days    3.  To help reduce HEADACHES: Coenzyme Q10 160mg  ONCE DAILY Riboflavin/Vitamin B2 400mg  ONCE DAILY Magnesium oxide 400mg  ONCE - TWICE DAILY May stop after headaches are resolved.                                                                                               4.  To help with INSOMNIA: Melatonin 3mg  AT BEDTIME    5.  Other medicines to help decrease inflammation Alpha Lipoic Acid 100mg  TWICE DAILY Turmeric 500mg  twice daily  6.  Refer to neuropsychological evaluation  7.  Meclizine as needed for dizziness but use sparingly.  Continue vestibular rehabilitation  8.  Follow up with psychiatry and counseling as soon as possible.  9.  Follow up with me in 6 weeks or after neuropsychological testing.

## 2020-10-04 ENCOUNTER — Encounter: Payer: 59 | Admitting: Physical Therapy

## 2020-10-05 ENCOUNTER — Ambulatory Visit: Payer: 59 | Admitting: Physical Therapy

## 2020-10-11 ENCOUNTER — Ambulatory Visit: Payer: 59 | Admitting: Physical Therapy

## 2020-10-13 ENCOUNTER — Ambulatory Visit: Payer: 59 | Attending: Internal Medicine | Admitting: Physical Therapy

## 2020-10-13 ENCOUNTER — Other Ambulatory Visit: Payer: Self-pay

## 2020-10-13 ENCOUNTER — Telehealth: Payer: Self-pay | Admitting: Physical Therapy

## 2020-10-13 DIAGNOSIS — R2689 Other abnormalities of gait and mobility: Secondary | ICD-10-CM | POA: Insufficient documentation

## 2020-10-13 DIAGNOSIS — R2681 Unsteadiness on feet: Secondary | ICD-10-CM | POA: Diagnosis present

## 2020-10-13 DIAGNOSIS — R262 Difficulty in walking, not elsewhere classified: Secondary | ICD-10-CM | POA: Diagnosis present

## 2020-10-13 DIAGNOSIS — R42 Dizziness and giddiness: Secondary | ICD-10-CM | POA: Insufficient documentation

## 2020-10-13 DIAGNOSIS — F0781 Postconcussional syndrome: Secondary | ICD-10-CM | POA: Diagnosis present

## 2020-10-13 DIAGNOSIS — R4189 Other symptoms and signs involving cognitive functions and awareness: Secondary | ICD-10-CM

## 2020-10-13 NOTE — Telephone Encounter (Signed)
Allison Le, Could you place a referral to speech therapy for cognitive rehab?  Thanks

## 2020-10-13 NOTE — Therapy (Addendum)
[facility] [address] [place], [place], [address] Phone: [phone]   Fax:  [phone]  Physical Therapy Treatment and Re-Certification  Patient Details  Name: Allison Le MRN: [id number] Date of Birth: [birth year] Referring Provider (PT): Drema Dallas, DO   Encounter Date: 10/13/2020   PT End of Session - 10/13/20 1501    Visit Number 5    Number of Visits 10    Date for PT Re-Evaluation 11/10/20    Authorization Type [employer]    PT Start Time 1410    PT Stop Time 1450    PT Time Calculation (min) 40 min    Equipment Utilized During Treatment Gait belt    Activity Tolerance Patient limited by fatigue    Behavior During Therapy [facility] for tasks assessed/performed;Anxious           Past Medical History:  Diagnosis Date  . Anxiety and depression    age 60  . Colitis   . Depression   . Migraine 2016   Headaches all her life.  Diagnosed with Migraines at [facility]    Past Surgical History:  Procedure Laterality Date  . ABDOMINAL HYSTERECTOMY    . HIP SURGERY      There were no vitals filed for this visit.   Subjective Assessment - 10/13/20 1410    Subjective Pt states she has been feeling better. Pt reports her brain is giving her the most of her problems. Pt states she is just starting to get a little bit of energy. Pt reports she is tired today. Pt reports bouts of dizziness but less frequent. Pt reports improved neck pain. Pt states she's trying to do the neck stretches. Pt reports ongoing headaches ~2-3/day.    Pertinent History anxiety    Limitations Lifting;Walking;Standing;House hold activities    Patient Stated Goals Improve dizziness and balance; safe return to work and the gym    Currently in Pain? Yes    Pain Score 1     Pain Location Neck              [facility] PT Assessment - 10/13/20 0001      Ambulation/Gait   Ambulation/Gait Yes    Ambulation/Gait Assistance 6: Modified  independent (Device/Increase time)    Ambulation Distance (Feet) 115 Feet    Assistive device None    Gait Pattern Step-through pattern;Decreased stride length;Shuffle    Ambulation Surface Level;Indoor    Gait velocity 1.68 ft/sec    Gait Comments knee brace donned      Dynamic Gait Index   Level Surface Normal    Change in Gait Speed Mild Impairment    Gait with Horizontal Head Turns Mild Impairment    Gait with Vertical Head Turns Mild Impairment    Gait and Pivot Turn Normal    Step Over Obstacle Mild Impairment    Step Around Obstacles Normal    Steps Mild Impairment    Total Score 19    DGI comment: 19/24                          Vestibular Treatment/Exercise - 10/13/20 0001      X1 Viewing Horizontal   Foot Position Standing feet together    Comments 2x30 sec      X1 Viewing Vertical   Foot Position Standing feet togethre    Comments 2x30 sec      Eye/Head Exercise Horizontal  Foot Position Feet together on solid surface    Comments 2x30 sec saccades & then smooth pursuit      Eye/Head Exercise Vertical   Foot Position Feet together on solid surface    Comments 2x30 sec saccades & then smooth pursuit                 PT Education - 10/13/20 1510    Education Details Discussed her HEP, continue neck stretching/work on posture.    Person(s) Educated Patient    Methods Explanation;Verbal cues    Comprehension Verbalized understanding            PT Short Term Goals - 10/13/20 1500      PT SHORT TERM GOAL #1   Title Pt will be independent with initial HEP    Time 3    Period Weeks    Status Achieved    Target Date 09/20/20      PT SHORT TERM GOAL #2   Title Pt will be able to return to work with subjective report of <50% of current symptoms (Target date 11/10/20 with LTGs)    Baseline Unable to work; still not at work -- dizziness less of an issue and feeling more headaches (09/20/20)    Time 4    Period Weeks    Status Revised     Target Date 11/10/20      PT SHORT TERM GOAL #3   Title Pt will have improved gait speed to at least 3 ft/sec to be within age norms (Target date 11/10/20 with LTGs)    Baseline 1.98 ft/sec on eval; unable to test today due to pt anxiety (09/20/20); 1.68 ft/sec on 10/13/20    Time 4    Period Weeks    Status Revised    Target Date 11/10/20             PT Long Term Goals - 10/13/20 1457      PT LONG TERM GOAL #1   Title Pt will be independent with advanced HEP    Baseline Has not been consistent    Time 4    Period Weeks    Status On-going    Target Date 11/10/20      PT LONG TERM GOAL #2   Title Pt will improve DGI to at least 22/24 to demo reduced fall risk    Baseline 12/24 on eval; 19/24 on 10/13/20    Time 4    Period Weeks    Status Revised    Target Date 11/10/20      PT LONG TERM GOAL #3   Title Pt will have improved DHI score to </=30 to demo mild impairment    Baseline 60/100 rated as severe    Time 4    Period Weeks    Status Revised    Target Date 11/10/20      PT LONG TERM GOAL #4   Title Pt will have improved FOTO score to 72%    Baseline 54% on eval    Time 4    Period Weeks    Status Revised    Target Date 11/10/20                 Plan - 10/13/20 1502    Clinical Impression Statement Treatment session focused on continuing to address pt's vertiginous symptoms. Pt reports decrease in neck tightness and frequency of her dizziness. On assessment, pt has yet to fully meet her LTGs but is demonstrating improved  DGI scores. Encouraged pt to continue to address her neck tightness with stretches and being mindful of her shoulder posture. Pt would benefit from additional 4 weeks of PT to optimize her level of function.    Personal Factors and Comorbidities Comorbidity 1;Comorbidity 2;Time since onset of injury/illness/exacerbation    Comorbidities anxiety, post concussion syndrome    Examination-Activity Limitations Caring for  Others;Bend;Hygiene/Grooming;Locomotion Level;Squat;Stairs;Stand;Toileting;Transfers;Bed Mobility    Examination-Participation Restrictions Cleaning;Community Activity;Occupation;Yard Work;Meal Prep    Stability/Clinical Decision Making Evolving/Moderate complexity    Rehab Potential Good    PT Frequency Other (comment)   2x/wk for 3 wks; transition 1x/wk for 3 more wks   PT Duration Other (comment)    PT Treatment/Interventions ADLs/Self Care Home Management;Canalith Repostioning;Gait training;Stair training;Functional mobility training;Therapeutic activities;Therapeutic exercise;Balance training;Neuromuscular re-education;Patient/family education;Manual techniques;Passive range of motion;Energy conservation;Taping;Vestibular    PT Next Visit Plan Check FOTO. How were the eye and neck exercises and stretches? Continue manual therapy/stretches and postural exervices for cervical spine as needed. Continue gaze stabilization/saccade as tolerated. Please follow-up on speech therapy (referring provider has been notified).    PT Home Exercise Plan Access Code [email or web address]    Recommended Other Services Pt would like to pursue speech therapy    Consulted and Agree with Plan of Care Patient           Patient will benefit from skilled therapeutic intervention in order to improve the following deficits and impairments:  Abnormal gait,Difficulty walking,Dizziness,Decreased activity tolerance,Decreased balance,Decreased mobility,Decreased strength  Visit Diagnosis: Dizziness and giddiness  Difficulty in walking, not elsewhere classified  Unsteadiness on feet  Other abnormalities of gait and mobility  Post concussion syndrome     Problem List Patient Active Problem List   Diagnosis Date Noted  . Chronic obstructive pulmonary disease (HCC) 02/22/2019  . Insomnia 02/22/2019  . Anxiety and depression   . Migraine 09/30/2014    Kendrick Remigio April Ma L Edrei Norgaard PT, DPT 10/13/2020, 3:12 PM  [facility] [address] [place], [place], [address] Phone: [phone]   Fax:  [phone]  Name: Payslie Mccaig MRN: [id number] Date of Birth: [birth year]

## 2020-10-13 NOTE — Addendum Note (Signed)
Addended by: Jules Husbands MARIE L on: 10/13/2020 03:14 PM   Modules accepted: Orders

## 2020-10-13 NOTE — Telephone Encounter (Signed)
Hello Dr. Everlena Cooper!  I'm currently seeing Ms. Allison Le in physical therapy. Although she is making some progress with her vestibular rehab/dizziness, she continues to complain of brain fog and cognitive deficits. She has come to therapy on a few occasions on the wrong day.   I had discussed a speech therapy referral with her and she seems interested. If you feel that this may be beneficial for her as well, I'm sure she would love to have an order placed.  Thank you, [name] April Dell Ponto, PT, DPT

## 2020-10-16 NOTE — Telephone Encounter (Signed)
Per DR.JAffe Referral to speech therapy added

## 2020-10-24 ENCOUNTER — Ambulatory Visit: Payer: 59 | Admitting: Neurology

## 2020-10-27 ENCOUNTER — Ambulatory Visit: Payer: 59 | Admitting: Physical Therapy

## 2020-11-12 ENCOUNTER — Other Ambulatory Visit: Payer: Self-pay | Admitting: Neurology

## 2020-11-16 ENCOUNTER — Other Ambulatory Visit: Payer: Self-pay

## 2020-11-16 ENCOUNTER — Ambulatory Visit (INDEPENDENT_AMBULATORY_CARE_PROVIDER_SITE_OTHER): Payer: 59 | Admitting: Psychology

## 2020-11-16 ENCOUNTER — Encounter: Payer: Self-pay | Admitting: Psychology

## 2020-11-16 ENCOUNTER — Ambulatory Visit: Payer: 59 | Admitting: Psychology

## 2020-11-16 DIAGNOSIS — F332 Major depressive disorder, recurrent severe without psychotic features: Secondary | ICD-10-CM | POA: Diagnosis not present

## 2020-11-16 DIAGNOSIS — F410 Panic disorder [episodic paroxysmal anxiety] without agoraphobia: Secondary | ICD-10-CM

## 2020-11-16 DIAGNOSIS — R4189 Other symptoms and signs involving cognitive functions and awareness: Secondary | ICD-10-CM

## 2020-11-16 DIAGNOSIS — F411 Generalized anxiety disorder: Secondary | ICD-10-CM

## 2020-11-16 DIAGNOSIS — S060X0S Concussion without loss of consciousness, sequela: Secondary | ICD-10-CM | POA: Diagnosis not present

## 2020-11-16 DIAGNOSIS — F4310 Post-traumatic stress disorder, unspecified: Secondary | ICD-10-CM | POA: Diagnosis not present
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
## 2020-11-16 NOTE — Progress Notes (Signed)
   Psychometrician Note   Cognitive testing was administered to Samson Frederic by Wallace Keller, B.S. (psychometrist) under the supervision of Dr. Newman Nickels, Ph.D., licensed psychologist on 11/16/20. Ms. Bump did not appear overtly distressed by the testing session per behavioral observation or responses across self-report questionnaires. Dr. Newman Nickels, Ph.D. checked in with Ms. Silvernail as needed to manage any distress related to testing procedures (if applicable). Rest breaks were offered.    The battery of tests administered was selected by Dr. Newman Nickels, Ph.D. with consideration to Ms. Kasler's current level of functioning, the nature of her symptoms, emotional and behavioral responses during interview, level of literacy, observed level of motivation/effort, and the nature of the referral question. This battery was communicated to the psychometrist. Communication between Dr. Newman Nickels, Ph.D. and the psychometrist was ongoing throughout the evaluation and Dr. Newman Nickels, Ph.D. was immediately accessible at all times. Dr. Newman Nickels, Ph.D. provided supervision to the psychometrist on the date of this service to the extent necessary to assure the quality of all services provided.    Nariya Neumeyer will return within approximately 1-2 weeks for an interactive feedback session with Dr. Milbert Coulter at which time her test performances, clinical impressions, and treatment recommendations will be reviewed in detail. Ms. Antony understands she can contact our office should she require our assistance before this time.  A total of 150 minutes of billable time were spent face-to-face with Ms. Chubbuck by the psychometrist. This includes both test administration and scoring time. Billing for these services is reflected in the clinical report generated by Dr. Newman Nickels, Ph.D..  This note reflects time spent with the psychometrician and does not include test scores or any  clinical interpretations made by Dr. Milbert Coulter. The full report will follow in a separate note.

## 2020-11-20 ENCOUNTER — Ambulatory Visit: Payer: 59

## 2020-11-23 ENCOUNTER — Other Ambulatory Visit: Payer: Self-pay

## 2020-11-23 ENCOUNTER — Ambulatory Visit (INDEPENDENT_AMBULATORY_CARE_PROVIDER_SITE_OTHER): Payer: 59 | Admitting: Psychology

## 2020-11-23 DIAGNOSIS — F4310 Post-traumatic stress disorder, unspecified: Secondary | ICD-10-CM | POA: Diagnosis not present

## 2020-11-23 DIAGNOSIS — F410 Panic disorder [episodic paroxysmal anxiety] without agoraphobia: Secondary | ICD-10-CM

## 2020-11-23 DIAGNOSIS — F0781 Postconcussional syndrome: Secondary | ICD-10-CM

## 2020-11-23 DIAGNOSIS — F332 Major depressive disorder, recurrent severe without psychotic features: Secondary | ICD-10-CM

## 2020-11-23 DIAGNOSIS — F411 Generalized anxiety disorder: Secondary | ICD-10-CM | POA: Diagnosis not present

## 2020-11-23 NOTE — Patient Instructions (Signed)
Based upon the severity of ongoing psychiatric symptoms, I agree with Dr. Everlena Cooper that Allison Le would likely have an extremely hard time finding and maintaining meaningful employment at the present time. While this may change with appropriate psychiatric treatment in the future, I do feel that current efforts towards obtaining disability benefits are appropriate.  Allison Le stated that a previous provider (not her psychiatrist) had stated he/she was fearful of adjusting medications as her symptoms could worsen. I would argue that it is better to attempt improvement (in a controlled setting if necessary) rather than leave Allison Le stuck in a clearly distressing and poor position which will only lead to further deterioration. Allison Le is strongly encouraged to discuss medication changes and/or dosing adjustments with her psychiatrist as she reported to me that her current medication regimen is entirely ineffective.  A combination of medication and psychotherapy has been shown to be most effective at treating symptoms of psychiatric distress. Allison Le vaguely described what seemed like attempted talk therapy with her psychiatrist. However, she stated that all they had done over five sessions was gather history and they had not engaged in any sort of therapeutic treatment. It is also possible that Allison Le has mistaken her psychiatrist's role as he/she may only be assessing her current presentation during each visit to help with medication adjustments and treatment planning. I would strongly encourage Allison Le to engage in proper psychotherapy with a licensed psychologist or mental health counselor. Treating PTSD is a Systems developer and requires specialized training that not all clinicians have. She would benefit from working with a therapist/counselor who has experience working with PTSD and can perform Cognitive Processing Therapy (CPT). I will place a referral for her.   Allison Le should also be aware that both Klonopin and oxycodone have well-known cognitive side effects which could be contributing to brain fog and other cognitive dysfunction. She could discuss alternative medication options with her psychiatrist if desired.   Allison Le is encouraged to attend to lifestyle factors for brain health (e.g., regular physical exercise, good nutrition habits, regular participation in cognitively-stimulating activities, and general stress management techniques), which are likely to have benefits for both emotional adjustment and cognition. In fact, in addition to promoting good general health, regular exercise incorporating aerobic activities (e.g., brisk walking, jogging, cycling, etc.) has been demonstrated to be a very effective treatment for depression and stress, with similar efficacy rates to both antidepressant medication and psychotherapy.  When learning new information, she would benefit from information being broken up into small, manageable pieces. She may also find it helpful to articulate the material in her own words and in a context to promote encoding at the onset of a new task. This material may need to be repeated multiple times to promote encoding.  Memory can be improved using internal strategies such as rehearsal, repetition, chunking, mnemonics, association, and imagery. External strategies such as written notes in a consistently used memory journal, visual and nonverbal auditory cues such as a calendar on the refrigerator or appointments with alarm, such as on a cell phone, can also help maximize recall.    To address problems with processing speed, she may wish to consider:   -Ensuring that she is alerted when essential material or instructions are being presented   -Adjusting the speed at which new information is presented   -Allowing for more time in comprehending, processing, and responding in conversation  To address problems with  fluctuating attention, she may wish to  consider:   -Avoiding external distractions when needing to concentrate   -Limiting exposure to fast paced environments with multiple sensory demands   -Writing down complicated information and using checklists   -Attempting and completing one task at a time (i.e., no multi-tasking)   -Verbalizing aloud each step of a task to maintain focus   -Taking frequent breaks during the completion of steps/tasks to avoid fatigue   -Reducing the amount of information considered at one time   -Scheduling more difficult activities for a time of day where she is usually most alert  Reducing anxiety may also aid in the retrieval of information. Allison Le is encouraged to prepare scripts she can use socially when she experiences difficulty with word finding or memory. Such scripts should be brief explanations of the difficulty (e.g., "the word escapes me now") and allow her to move the conversation forward quickly rather than dwelling on the issue.
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
## 2020-11-24 ENCOUNTER — Telehealth: Payer: Self-pay | Admitting: Psychology

## 2020-11-24 NOTE — Telephone Encounter (Signed)
Excellent, thank you. During her feedback appointment yesterday, she requested that I fax the results of her neuropsychological report to her psychiatrist. I will do that this afternoon.

## 2020-11-24 NOTE — Telephone Encounter (Signed)
Patient called in stating she was supposed to provide her therapists fax number. The fax number is [phone].
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
## 2020-11-29 ENCOUNTER — Ambulatory Visit (INDEPENDENT_AMBULATORY_CARE_PROVIDER_SITE_OTHER): Payer: 59 | Admitting: Neurology

## 2020-11-29 ENCOUNTER — Other Ambulatory Visit: Payer: Self-pay

## 2020-11-29 ENCOUNTER — Encounter: Payer: Self-pay | Admitting: Neurology

## 2020-11-29 VITALS — BP 102/67 | HR 94 | Ht 62.0 in | Wt 117.6 lb

## 2020-11-29 DIAGNOSIS — F4310 Post-traumatic stress disorder, unspecified: Secondary | ICD-10-CM | POA: Diagnosis not present

## 2020-11-29 DIAGNOSIS — G44309 Post-traumatic headache, unspecified, not intractable: Secondary | ICD-10-CM | POA: Diagnosis not present

## 2020-11-29 MED ORDER — GABAPENTIN 300 MG PO CAPS: 300.0000 mg | ORAL_CAPSULE | Freq: Two times a day (BID) | ORAL | 5 refills | Status: DC

## 2020-11-29 NOTE — Patient Instructions (Signed)
1.  We will refer you again to psychology AND psychiatry 2.  We will increase gabapentin: -  Using the 100mg  pills:   Take 2 pills twice daily for a week     Then 2 pills in morning and 3 pills at bedtime for a week.     Then stop the 100mg  pills and start taking the 300mg  pill twice daily 3.  Limit use of pain relievers to no more than 2 days out of week to prevent risk of rebound or medication-overuse headache. 4.  Follow up in 6 months.

## 2020-11-30 ENCOUNTER — Ambulatory Visit: Payer: 59

## 2020-12-04 ENCOUNTER — Telehealth: Payer: Self-pay | Admitting: Neurology

## 2020-12-04 ENCOUNTER — Encounter: Payer: Self-pay | Admitting: Neurology

## 2020-12-04 NOTE — Telephone Encounter (Addendum)
Patient called for help with scheduling a referral to a psychiatrist. She will call [facility] for scheduling in the meantime.  She also needs a letter by 12/09/20 stating she is still under Dr. Moises Blood care. Please call when ready for her social worker, Ms. Orson Aloe

## 2020-12-04 NOTE — Telephone Encounter (Signed)
Letter has been written and is in [facility]

## 2020-12-08 NOTE — Telephone Encounter (Signed)
Pt called and informed that letter is ready for her to come and pick up

## 2020-12-28 ENCOUNTER — Ambulatory Visit: Payer: 59 | Admitting: Psychologist

## 2020-12-28 ENCOUNTER — Telehealth: Payer: Self-pay | Admitting: Neurology

## 2020-12-28 NOTE — Telephone Encounter (Signed)
Patient states that she completed the visit with behavioral health and was not pleased with the provider. She doesn't want to follow up with him again. She is requesting that Dr Everlena Cooper write her a letter stating along the lines that there is nothing else he can offer for her so that she can provide that letter to her lawyers. She would still like to be a patient here, she is just needing a letter to provide to her lawyers to show that she has been seen by a neurologist and has followed the recommendations and there is nothing more to offer. Please call.

## 2020-12-29 NOTE — Telephone Encounter (Signed)
We can provide a letter stating such

## 2021-01-02 ENCOUNTER — Other Ambulatory Visit: Payer: Self-pay | Admitting: Neurology

## 2021-01-04 NOTE — Telephone Encounter (Signed)
Per DR.JAffe and Eber Jones please send records to lawyer.

## 2021-01-05 NOTE — Telephone Encounter (Signed)
Patient called in stating she got a voicemail, but the reception wasn't great and she didn't get all the message. She mentioned she was not sure exactly what was ready for her.

## 2021-01-05 NOTE — Telephone Encounter (Signed)
LMOVM, we are not sending a letter. We will send records with such referrls pt was advised to do and that she did see Jaffe in office.

## 2021-01-27 ENCOUNTER — Ambulatory Visit (HOSPITAL_COMMUNITY)
Admission: EM | Admit: 2021-01-27 | Discharge: 2021-01-27 | Disposition: A | Discharge status: Home / Self Care | Payer: 59 | Attending: Emergency Medicine | Admitting: Emergency Medicine

## 2021-01-27 ENCOUNTER — Other Ambulatory Visit: Payer: Self-pay

## 2021-01-27 ENCOUNTER — Encounter (HOSPITAL_COMMUNITY): Payer: Self-pay

## 2021-01-27 DIAGNOSIS — N3001 Acute cystitis with hematuria: Secondary | ICD-10-CM

## 2021-01-27 DIAGNOSIS — B9689 Other specified bacterial agents as the cause of diseases classified elsewhere: Secondary | ICD-10-CM | POA: Diagnosis not present

## 2021-01-27 LAB — POCT URINALYSIS DIPSTICK, ED / UC
Glucose, UA: 250 mg/dL — AB
Nitrite: POSITIVE — AB
Protein, ur: 30 mg/dL — AB
Specific Gravity, Urine: 1.01 (ref 1.005–1.030)
Urobilinogen, UA: 2 mg/dL — ABNORMAL HIGH (ref 0.0–1.0)
pH: 5 (ref 5.0–8.0)

## 2021-01-27 LAB — CBG MONITORING, ED: Glucose-Capillary: 90 mg/dL (ref 70–99)

## 2021-01-27 MED ORDER — CEPHALEXIN 500 MG PO CAPS: 500.0000 mg | ORAL_CAPSULE | Freq: Two times a day (BID) | ORAL | 0 refills | Status: AC
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
## 2021-01-27 NOTE — Discharge Instructions (Addendum)
Take the keflex twice a day for the next 7 days.   You can continue to take the AZO for symptom management.    Make sure you are drinking plenty of fluids, especially water.   Get established and follow up with a primary care provider.    Return or go to the Emergency Department if symptoms worsen or do not improve in the next few days.

## 2021-01-27 NOTE — ED Triage Notes (Signed)
Pt present urinary frequency with elevated Blood pressure and glucose. Pt states that she has been dizziness and fatigue. Symptom started between 3-5 days ago. Every day was a different for patient.

## 2021-02-01 ENCOUNTER — Other Ambulatory Visit: Payer: Self-pay

## 2021-02-01 ENCOUNTER — Telehealth: Payer: Self-pay | Admitting: Neurology

## 2021-02-01 MED ORDER — GABAPENTIN 300 MG PO CAPS: 300.0000 mg | ORAL_CAPSULE | Freq: Two times a day (BID) | ORAL | 1 refills | Status: DC

## 2021-02-01 NOTE — Telephone Encounter (Signed)
New message    1. Which medications need to be refilled? (please list name of each medication and dose if known) gabapentin (NEURONTIN) 300 MG capsule  2. Which pharmacy/location (including street and city if local pharmacy) is medication to be sent to?[id number] - [place], [place] - [address]  3. Do they need a 30 day or 90 day supply? 90 day supply

## 2021-02-01 NOTE — Telephone Encounter (Signed)
Sent rx in to pharmacy 

## 2021-02-05 ENCOUNTER — Encounter: Payer: Self-pay | Admitting: *Deleted

## 2021-02-13 ENCOUNTER — Telehealth: Payer: Self-pay | Admitting: Neurology

## 2021-02-13 NOTE — Telephone Encounter (Signed)
Patient called and requested a call back from a nurse. She'd like to speak to someone about a letter she requested previously.

## 2021-02-13 NOTE — Telephone Encounter (Signed)
Spoke with pt will mail her a copy of her last office visit she thinks that it will be better than a letter. Will mail to address on file,

## 2021-02-27 ENCOUNTER — Telehealth: Payer: Self-pay | Admitting: Neurology

## 2021-02-27 NOTE — Telephone Encounter (Signed)
Patient called and said she has had a headache for 5 out of the last 6 days. She needs some help.  [facility] [address] in [place]

## 2021-02-28 NOTE — Telephone Encounter (Signed)
Headaches better today, gabapentin 300mg  bid wants to know if she can increase due to headaches daily x 5 days.

## 2021-03-01 ENCOUNTER — Other Ambulatory Visit: Payer: Self-pay | Admitting: Neurology

## 2021-03-01 ENCOUNTER — Other Ambulatory Visit: Payer: Self-pay

## 2021-03-01 MED ORDER — GABAPENTIN 400 MG PO CAPS: 400.0000 mg | ORAL_CAPSULE | Freq: Two times a day (BID) | ORAL | 0 refills | Status: DC

## 2021-03-01 NOTE — Telephone Encounter (Signed)
No answer at 924 03/01/2021

## 2021-03-01 NOTE — Telephone Encounter (Signed)
We will increase gabapentin to 400mg  twice daily.  Please send a new prescription for gabapentin 400mg  pill twice daily

## 2021-03-01 NOTE — Telephone Encounter (Signed)
Patient advised and rx sent to [facility] to sign.

## 2021-03-19 ENCOUNTER — Ambulatory Visit: Payer: 59 | Admitting: Neurology

## 2021-05-21 ENCOUNTER — Telehealth: Payer: Self-pay | Admitting: Neurology

## 2021-05-21 NOTE — Telephone Encounter (Signed)
Pt advised of Dr.Jaffe  note, Message Received: Today Allison Dallas, DO  [name], CMA Caller: Unspecified (Today, 12:06 PM) Based on information from her last appointment in March, she did not meet criteria for Botox.  However, it appears that she has established care with a new neurologist last month.  She may want to address it with him.  She should only be under the care of one neurologist for her headaches.        Per pt, She saw that Nerurologst onetime. She was advised by her Lawyer wanted her to have a second opioion.    Pt states she did not feel comofortable going to the referral to the pain clinic she was referred to by that perovider.s she was also advised of the Botox at that time.  And the provider was moving practice so she will be seeing that provider anymore.   So pt wanted to know if she could schedule a visit with dr.Jaffe?

## 2021-05-21 NOTE — Telephone Encounter (Signed)
Patient would like to talk to someone about getting Botox for her headache

## 2021-05-22 NOTE — Telephone Encounter (Signed)
LMOM for patient to call back to schedule

## 2021-06-07 ENCOUNTER — Ambulatory Visit: Payer: 59 | Admitting: Neurology
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
## 2021-06-11 ENCOUNTER — Other Ambulatory Visit: Payer: Self-pay

## 2021-06-11 ENCOUNTER — Encounter: Payer: Self-pay | Admitting: Neurology

## 2021-06-11 ENCOUNTER — Telehealth: Payer: Self-pay | Admitting: Neurology

## 2021-06-11 ENCOUNTER — Ambulatory Visit (INDEPENDENT_AMBULATORY_CARE_PROVIDER_SITE_OTHER): Payer: 59 | Admitting: Neurology

## 2021-06-11 VITALS — BP 143/85 | HR 85 | Ht 62.0 in | Wt 112.6 lb

## 2021-06-11 DIAGNOSIS — G4486 Cervicogenic headache: Secondary | ICD-10-CM | POA: Diagnosis not present

## 2021-06-11 DIAGNOSIS — F4310 Post-traumatic stress disorder, unspecified: Secondary | ICD-10-CM

## 2021-06-11 DIAGNOSIS — M542 Cervicalgia: Secondary | ICD-10-CM

## 2021-06-11 MED ORDER — GABAPENTIN 600 MG PO TABS: ORAL_TABLET | ORAL | 0 refills | Status: DC

## 2021-06-11 NOTE — Telephone Encounter (Signed)
Pt had an appt with jaffe this am, she forgot to ask him if he thinks upping her gabapentin will help her headaches.

## 2021-06-11 NOTE — Patient Instructions (Signed)
Will refer to pain management for neck pain and cervicogenic headache

## 2021-06-11 NOTE — Telephone Encounter (Signed)
Called patient and informed her that Dr. Everlena Cooper can up her Gabapentin to 600 mg twice a day to see if that helps. Patient is aware that the rx was sent to the pharmacy. Patient had no further questions or concerns.

## 2021-06-22 ENCOUNTER — Encounter: Payer: Self-pay | Admitting: Physical Medicine & Rehabilitation

## 2021-07-18 ENCOUNTER — Other Ambulatory Visit: Payer: Self-pay | Admitting: Neurology

## 2021-07-20 ENCOUNTER — Other Ambulatory Visit: Payer: Self-pay | Admitting: Neurology

## 2021-07-20 MED ORDER — GABAPENTIN 600 MG PO TABS: ORAL_TABLET | ORAL | 2 refills | Status: DC

## 2021-07-23 ENCOUNTER — Other Ambulatory Visit: Payer: Self-pay | Admitting: Neurology

## 2021-07-23 MED ORDER — GABAPENTIN 600 MG PO TABS: ORAL_TABLET | ORAL | 2 refills | Status: DC

## 2021-09-03 ENCOUNTER — Other Ambulatory Visit: Payer: Self-pay | Admitting: Neurology

## 2021-09-05 ENCOUNTER — Encounter: Payer: 59 | Attending: Physical Medicine & Rehabilitation | Admitting: Physical Medicine & Rehabilitation

## 2021-09-05 ENCOUNTER — Other Ambulatory Visit: Payer: Self-pay

## 2021-09-05 ENCOUNTER — Encounter: Payer: Self-pay | Admitting: Physical Medicine & Rehabilitation

## 2021-09-05 VITALS — BP 112/77 | HR 88 | Temp 98.3°F | Ht 62.0 in | Wt 111.4 lb

## 2021-09-05 DIAGNOSIS — F0781 Postconcussional syndrome: Secondary | ICD-10-CM | POA: Diagnosis not present

## 2021-09-05 DIAGNOSIS — G44321 Chronic post-traumatic headache, intractable: Secondary | ICD-10-CM | POA: Diagnosis present

## 2021-09-05 DIAGNOSIS — R5382 Chronic fatigue, unspecified: Secondary | ICD-10-CM | POA: Diagnosis not present

## 2021-09-05 DIAGNOSIS — G4489 Other headache syndrome: Secondary | ICD-10-CM | POA: Insufficient documentation

## 2021-09-05 DIAGNOSIS — G479 Sleep disorder, unspecified: Secondary | ICD-10-CM | POA: Insufficient documentation

## 2021-09-05 DIAGNOSIS — F4310 Post-traumatic stress disorder, unspecified: Secondary | ICD-10-CM | POA: Insufficient documentation

## 2021-09-05 MED ORDER — QUETIAPINE FUMARATE 25 MG PO TABS: 25.0000 mg | ORAL_TABLET | Freq: Every day | ORAL | 1 refills | Status: DC

## 2021-09-05 MED ORDER — TOPIRAMATE 25 MG PO TABS: 25.0000 mg | ORAL_TABLET | Freq: Every day | ORAL | 2 refills | Status: DC

## 2021-09-05 NOTE — Patient Instructions (Addendum)
WEEK 1 START SEROQUEL FOR SLEEP TONIGHT AT BEDTIME  DECREASE GABAPENTIN TO 300MG  TWICE DAILY   WEEK 2 CONTINUE SEROQUEL  START TOPAMAX 25MG  AT BEDTIME  DECREASE GABAPENTIN TO 300MG  AT BEDTIME   WEEK 3  CONTINUE SEROQUEL  INCREASE TOPAMAX TO 50MG  AT BEDTIME  STOP GABAPENTIN   CREATE A QUIET, RELAXING SLEEP ENVIRONMENT
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
## 2021-09-06 ENCOUNTER — Telehealth: Payer: Self-pay | Admitting: Neurology

## 2021-09-06 NOTE — Telephone Encounter (Signed)
Telephone call to pt, Pt wanted to know what are the steps to tapering off of Gabapebtin 600 mg BID

## 2021-09-06 NOTE — Telephone Encounter (Signed)
Pt called and left a message requesting to speak with Dr. Moises Blood assistant about her gabapentin.

## 2021-09-07 NOTE — Telephone Encounter (Signed)
Tried calling pt, No answer. LMOVM to call the office back.   Per Dr.Jaffe,  She can take one capsule (600mg ) at bedtime for a week, then stop.

## 2021-09-07 NOTE — Telephone Encounter (Signed)
Pt called an informed She can take one capsule (600mg ) at bedtime for a week, then stop.  Pt would like to scheduled for a follow up appointment with Dr she had one schedule in Jan but cancelled it

## 2021-09-07 NOTE — Telephone Encounter (Signed)
Spoke with patient she is sch for 12-04-21 with [employer]

## 2021-09-07 NOTE — Telephone Encounter (Signed)
Patient left a message returning the call to [facility].

## 2021-09-27 ENCOUNTER — Other Ambulatory Visit: Payer: Self-pay | Admitting: Physical Medicine & Rehabilitation

## 2021-09-27 DIAGNOSIS — F4310 Post-traumatic stress disorder, unspecified: Secondary | ICD-10-CM

## 2021-09-27 DIAGNOSIS — G479 Sleep disorder, unspecified: Secondary | ICD-10-CM

## 2021-10-16 ENCOUNTER — Other Ambulatory Visit: Payer: Self-pay | Admitting: Physical Medicine & Rehabilitation

## 2021-10-16 DIAGNOSIS — F0781 Postconcussional syndrome: Secondary | ICD-10-CM

## 2021-10-16 DIAGNOSIS — G44321 Chronic post-traumatic headache, intractable: Secondary | ICD-10-CM

## 2021-10-29 ENCOUNTER — Ambulatory Visit: Payer: 59 | Admitting: Neurology

## 2021-11-15 ENCOUNTER — Other Ambulatory Visit: Payer: Self-pay | Admitting: Neurology

## 2021-12-04 ENCOUNTER — Ambulatory Visit: Payer: 59 | Admitting: Neurology

## 2021-12-05 ENCOUNTER — Encounter: Payer: 59 | Admitting: Physical Medicine & Rehabilitation

## 2022-02-14 ENCOUNTER — Other Ambulatory Visit: Payer: Self-pay

## 2022-02-14 ENCOUNTER — Encounter (HOSPITAL_BASED_OUTPATIENT_CLINIC_OR_DEPARTMENT_OTHER): Payer: Self-pay | Admitting: Emergency Medicine

## 2022-02-14 ENCOUNTER — Emergency Department (HOSPITAL_BASED_OUTPATIENT_CLINIC_OR_DEPARTMENT_OTHER): Payer: 59

## 2022-02-14 ENCOUNTER — Emergency Department (HOSPITAL_BASED_OUTPATIENT_CLINIC_OR_DEPARTMENT_OTHER)
Admission: EM | Admit: 2022-02-14 | Discharge: 2022-02-14 | Disposition: A | Discharge status: Home / Self Care | Payer: 59 | Attending: Emergency Medicine | Admitting: Emergency Medicine

## 2022-02-14 DIAGNOSIS — N390 Urinary tract infection, site not specified: Secondary | ICD-10-CM | POA: Diagnosis not present

## 2022-02-14 DIAGNOSIS — R110 Nausea: Secondary | ICD-10-CM | POA: Diagnosis not present

## 2022-02-14 DIAGNOSIS — N3001 Acute cystitis with hematuria: Secondary | ICD-10-CM | POA: Insufficient documentation

## 2022-02-14 DIAGNOSIS — R109 Unspecified abdominal pain: Secondary | ICD-10-CM | POA: Diagnosis present

## 2022-02-14 DIAGNOSIS — Z7982 Long term (current) use of aspirin: Secondary | ICD-10-CM | POA: Diagnosis not present

## 2022-02-14 LAB — COMPREHENSIVE METABOLIC PANEL
ALT: 28 U/L (ref 0–44)
AST: 24 U/L (ref 15–41)
Albumin: 4.3 g/dL (ref 3.5–5.0)
Alkaline Phosphatase: 66 U/L (ref 38–126)
Anion gap: 9 (ref 5–15)
BUN: 19 mg/dL (ref 8–23)
CO2: 27 mmol/L (ref 22–32)
Calcium: 9.7 mg/dL (ref 8.9–10.3)
Chloride: 104 mmol/L (ref 98–111)
Creatinine, Ser: 0.99 mg/dL (ref 0.44–1.00)
GFR, Estimated: >60 mL/min (ref >60)
Glucose, Bld: 114 mg/dL — ABNORMAL HIGH (ref 70–99)
Potassium: 3.8 mmol/L (ref 3.5–5.1)
Sodium: 140 mmol/L (ref 135–145)
Total Bilirubin: 0.6 mg/dL (ref 0.3–1.2)
Total Protein: 6.8 g/dL (ref 6.5–8.1)

## 2022-02-14 LAB — URINALYSIS, ROUTINE W REFLEX MICROSCOPIC
Bilirubin Urine: NEGATIVE
Glucose, UA: NEGATIVE mg/dL
Hgb urine dipstick: NEGATIVE
Ketones, ur: NEGATIVE mg/dL
Nitrite: POSITIVE — AB
Specific Gravity, Urine: 1.019 (ref 1.005–1.030)
pH: 6.5 (ref 5.0–8.0)

## 2022-02-14 LAB — CBC
HCT: 41.8 % (ref 36.0–46.0)
Hemoglobin: 13.9 g/dL (ref 12.0–15.0)
MCH: 33.3 pg (ref 26.0–34.0)
MCHC: 33.3 g/dL (ref 30.0–36.0)
MCV: 100 fL (ref 80.0–100.0)
Platelets: 217 10*3/uL (ref 150–400)
RBC: 4.18 MIL/uL (ref 3.87–5.11)
RDW: 12.1 % (ref 11.5–15.5)
WBC: 6.9 10*3/uL (ref 4.0–10.5)
nRBC: 0 % (ref 0.0–0.2)

## 2022-02-14 MED ORDER — SODIUM CHLORIDE 0.9 % IV SOLN
1.0000 g | Freq: Once | INTRAVENOUS | Status: AC
  Administered 2022-02-14: 1 g via INTRAVENOUS
  Filled 2022-02-14: qty 10

## 2022-02-14 MED ORDER — OXYCODONE-ACETAMINOPHEN 5-325 MG PO TABS
1.0000 | ORAL_TABLET | Freq: Once | ORAL | Status: AC
  Administered 2022-02-14: 1 via ORAL
  Filled 2022-02-14: qty 1

## 2022-02-14 MED ORDER — ONDANSETRON HCL 4 MG/2ML IJ SOLN
4.0000 mg | Freq: Once | INTRAMUSCULAR | Status: AC
  Administered 2022-02-14: 4 mg via INTRAVENOUS
  Filled 2022-02-14: qty 2

## 2022-02-14 MED ORDER — FOSFOMYCIN TROMETHAMINE 3 G PO PACK
3.0000 g | PACK | Freq: Once | ORAL | Status: AC
  Administered 2022-02-14: 3 g via ORAL
  Filled 2022-02-14: qty 3

## 2022-02-14 MED ORDER — SODIUM CHLORIDE 0.9 % IV BOLUS
500.0000 mL | Freq: Once | INTRAVENOUS | Status: AC
  Administered 2022-02-14: 500 mL via INTRAVENOUS

## 2022-02-14 NOTE — Discharge Instructions (Signed)
I have attached a list of primary care offices to these papers.  Please call 1 to get established.  Stop taking the oral antibiotics that you have at home.  Return if your symptoms are not improving in the next 3 days or if you begin to develop fever, chills, nausea, vomiting or increasingly severe back pain.  It was a pleasure to meet you and I hope you feel better!

## 2022-02-14 NOTE — ED Notes (Signed)
Discharge paperwork given and understood. 

## 2022-02-14 NOTE — ED Provider Notes (Signed)
[facility] EMERGENCY DEPT Provider Note   CSN: [id number] Arrival date & time: 02/14/22  1543     History  Chief Complaint  Patient presents with   Abdominal Pain   Flank Pain    Allison Le is a 61 y.o. female the past medical history of recurrent UTIs presenting with continued urinary tract infection.  She reports that a little over a week ago she started to experience suprapubic pressure, dysuria, urgency and hesitation.  She says these are her normal UTI symptoms so [facility].  After 3 days of this she received a call from them that her urine was clear and that she does not need to take it but should follow-up with PCP.  3 days ago she went to [facility] and her UA was positive for infection.  She was placed on Macrobid on Monday.  She continues with UTI symptoms as well as left flank pain.  History of kidney stones and says that this feels similar.  No gross hematuria but does state that she is on Azo so it is hard to tell.  Abdominal Pain Associated symptoms: chills, dysuria and nausea   Associated symptoms: no diarrhea, no fever and no vomiting   Flank Pain Associated symptoms include abdominal pain.      Home Medications Prior to Admission medications   Medication Sig Start Date End Date Taking? Authorizing Provider  Ascorbic Acid (VITAMIN C PO) Take 2 tablets by mouth daily.    [provider]  Aspirin-Acetaminophen-Caffeine (GOODY HEADACHE PO) Take 1 packet by mouth as needed (pain,headache).    [provider]  b complex vitamins tablet Take 1 tablet by mouth daily.    [provider]  buPROPion (WELLBUTRIN XL) 300 MG 24 hr tablet Take 300 mg by mouth daily. 11/13/16   [provider]  cetirizine (ZYRTEC ALLERGY) 10 MG tablet Take 1 tablet (10 mg total) by mouth daily. 01/16/18   Law, Bea Graff, PA-C  cholecalciferol (VITAMIN D3) 25 MCG (1000 UT) tablet Take 1,000 Units  by mouth daily.    [provider]  clonazePAM (KLONOPIN) 0.5 MG tablet Take 0.5 mg by mouth 2 (two) times daily as needed for anxiety. 12/09/16   [provider]  dicyclomine (BENTYL) 20 MG tablet Take 1 tablet (20 mg total) by mouth 2 (two) times daily. 05/29/17   Glyn Ade, PA-C  FLUoxetine (PROZAC) 40 MG capsule Take 40 mg by mouth daily. 11/13/16   [provider]  gabapentin (NEURONTIN) 600 MG tablet TAKE 1 TABLET BY MOUTH TWICE A DAY 11/16/21   Tomi Likens, Adam R, DO  HYDROcodone-acetaminophen (NORCO/VICODIN) 5-325 MG tablet Take 1 tablet by mouth every 4 (four) hours as needed. 03/29/19   Isla Pence, MD  ibuprofen (ADVIL,MOTRIN) 200 MG tablet Take 400-600 mg by mouth every 6 (six) hours as needed.    [provider]  meclizine (ANTIVERT) 25 MG tablet Take 1 tablet (25 mg total) by mouth 3 (three) times daily as needed for dizziness. 09/21/20   Pieter Partridge, DO  Multiple Vitamin (MULTIVITAMIN) capsule Take 1 capsule by mouth daily.    [provider]  ondansetron (ZOFRAN ODT) 8 MG disintegrating tablet Take 1 tablet (8 mg total) by mouth every 8 (eight) hours as needed for nausea or vomiting. Patient not taking: Reported on 09/05/2021 01/28/17   Jeannett Senior, PA-C  ondansetron (ZOFRAN) 4 MG tablet Take 1 tablet (4 mg total) by mouth  daily as needed for up to 10 doses for nausea or vomiting. Patient not taking: Reported on 09/05/2021 09/04/19   Bonnita Hollow, MD  QUEtiapine (SEROQUEL) 25 MG tablet TAKE 1 TABLET BY MOUTH EVERYDAY AT BEDTIME 09/27/21   Meredith Staggers, MD  topiramate (TOPAMAX) 25 MG tablet TAKE 1 TABLET BY MOUTH EVERYDAY AT BEDTIME 10/16/21   Meredith Staggers, MD  Zinc 100 MG TABS Take 100 mg by mouth daily.    [provider]      Allergies    Sumatriptan, Zolpidem, Morphine and related, Nitrofurantoin, Tramadol, and Trazodone    Review of Systems   Review of Systems  Constitutional:  Positive for chills.  Negative for fever.  Gastrointestinal:  Positive for abdominal pain and nausea. Negative for diarrhea and vomiting.  Genitourinary:  Positive for dysuria, flank pain (left) and urgency.   Physical Exam Updated Vital Signs BP (!) 145/100 (BP Location: Right Arm)   Pulse 76   Temp 98.4 F (36.9 C) (Oral)   Resp 11   Ht 5\' 2"  (1.575 m)   Wt 50.3 kg   SpO2 95%   BMI 20.30 kg/m  Physical Exam Vitals and nursing note reviewed.  Constitutional:      Appearance: Normal appearance. She is not ill-appearing.  HENT:     Head: Normocephalic and atraumatic.  Eyes:     General: No scleral icterus.    Conjunctiva/sclera: Conjunctivae normal.  Cardiovascular:     Rate and Rhythm: Normal rate and regular rhythm.  Pulmonary:     Effort: Pulmonary effort is normal. No respiratory distress.  Abdominal:     General: Abdomen is flat.     Palpations: Abdomen is soft.     Tenderness: There is abdominal tenderness in the suprapubic area. There is no right CVA tenderness or left CVA tenderness.     Hernia: No hernia is present.  Skin:    Findings: No rash.  Neurological:     Mental Status: She is alert.  Psychiatric:        Mood and Affect: Mood normal.    ED Results / Procedures / Treatments   Labs (all labs ordered are listed, but only abnormal results are displayed) Labs Reviewed  URINALYSIS, ROUTINE W REFLEX MICROSCOPIC - Abnormal; Notable for the following components:      Result Value   Protein, ur TRACE (*)    Nitrite POSITIVE (*)    Leukocytes,Ua MODERATE (*)    Non Squamous Epithelial 0-5 (*)    All other components within normal limits  URINE CULTURE  CBC    EKG None  Radiology CT Renal Stone Study  Result Date: 02/14/2022 CLINICAL DATA:  Left flank pain.  History of renal stones. EXAM: CT ABDOMEN AND PELVIS WITHOUT CONTRAST TECHNIQUE: Multidetector CT imaging of the abdomen and pelvis was performed following the standard protocol without IV contrast. RADIATION DOSE  REDUCTION: This exam was performed according to the departmental dose-optimization program which includes automated exposure control, adjustment of the mA and/or kV according to patient size and/or use of iterative reconstruction technique. COMPARISON:  None Available. FINDINGS: Lower chest: No acute abnormality. Hepatobiliary: No focal liver abnormality is seen. No gallstones, gallbladder wall thickening, or biliary dilatation. Pancreas: Unremarkable. No pancreatic ductal dilatation or surrounding inflammatory changes. Spleen: Normal in size without focal abnormality. Adrenals/Urinary Tract: Adrenal glands are unremarkable. Kidneys are normal, without renal calculi, focal lesion, or hydronephrosis. Bladder is unremarkable. Stomach/Bowel: Stomach is within normal limits. Appendix appears normal. No  evidence of bowel wall thickening, distention, or inflammatory changes. Vascular/Lymphatic: No significant vascular findings are present. No enlarged abdominal or pelvic lymph nodes. Reproductive: Status post hysterectomy. No adnexal masses. Other: No abdominal wall hernia or abnormality. No abdominopelvic ascites. Musculoskeletal: No acute or significant osseous findings. IMPRESSION: 1.  No CT evidence of acute abdominal/pelvic process. 2. No evidence of nephrolithiasis or hydronephrosis. Multiple pelvic phleboliths. 3. Bowel loops are normal in caliber. Normal appendix. No evidence of colitis or diverticulitis. 4. Electronically Signed   By: Keane Police D.O.   On: 02/14/2022 17:17    Procedures Procedures   Medications Ordered in ED Medications  ondansetron (ZOFRAN) injection 4 mg (4 mg Intravenous Given 02/14/22 1706)  oxyCODONE-acetaminophen (PERCOCET/ROXICET) 5-325 MG per tablet 1 tablet (1 tablet Oral Given 02/14/22 1651)  sodium chloride 0.9 % bolus 500 mL (0 mLs Intravenous Stopped 02/14/22 1757)  cefTRIAXone (ROCEPHIN) 1 g in sodium chloride 0.9 % 100 mL IVPB (0 g Intravenous Stopped 02/14/22 1851)   fosfomycin (MONUROL) packet 3 g (3 g Oral Given 02/14/22 1814)    ED Course/ Medical Decision Making/ A&P                           Medical Decision Making Amount and/or Complexity of Data Reviewed Labs: ordered. Radiology: ordered.  Risk Prescription drug management.   This patient presents to the ED for concern of dysuria, urgency and frequency in the setting of known UTI.  Differential includes but is not limited to ascending urinary tract infection, nephrolithiasis, pyelonephritis, malignancy.   This is not an exhaustive differential.    Past Medical History / Co-morbidities / Social History: Recurrent UTI    Physical Exam: Physical exam performed. The pertinent findings include: Suprapubic tenderness  Lab Tests: I ordered, and personally interpreted labs.  The pertinent results include: Urinalysis with continued infection   Imaging Studies: I ordered imaging studies including CT renal. I independently visualized and interpreted imaging which showed no nephrolithiasis or other abnormalities. I agree with the radiologist interpretation.   Cardiac Monitoring:  Patient reported abnormal EKG in the past and requested EKG today.  No concerns, confirmed by Dr. Tamera Punt   Medications: I ordered medication including Zofran, Percocet and fluids. Reevaluation of the patient after these medicines showed that the patient improved.   She was given a dose of Rocephin and fosfomycin for refractory UTI.  Urine culture sent.   Disposition: After consideration of the diagnostic results and the patients response to treatment, I feel that patient does not have an emergent condition at this time that needs intervention.  Afebrile without white count, do not believe the patient has pyelonephritis or infected nephrolithiasis.  Blood work without abnormalities.  Because she has already been on Keflex and Macrobid without improvement she will be given a dose of fosfomycin and Rocephin in the  department.  She has been instructed to follow-up with a primary care provider if her symptoms are not improving and to return to the emergency department if anything worsens.  She is agreeable to this.  Thankful for her care and discharged in ambulatory condition.   I discussed this case with my attending physician Dr. Tamera Punt who cosigned this note including patient's presenting symptoms, physical exam, and planned diagnostics and interventions. Attending physician stated agreement with plan or made changes to plan which were implemented.      Final Clinical Impression(s) / ED Diagnoses Final diagnoses:  Acute cystitis with hematuria  Rx / DC Orders Results and diagnoses were explained to the patient. Return precautions discussed in full. Patient had no additional questions and expressed complete understanding.   This chart was dictated using voice recognition software.  Despite best efforts to proofread,  errors can occur which can change the documentation meaning.    Darliss Ridgel 02/14/22 Gray Bernhardt, MD 02/14/22 2010

## 2022-02-14 NOTE — ED Notes (Signed)
Pt being held due to Rocephin being given.

## 2022-02-14 NOTE — ED Triage Notes (Signed)
Pt arrives to ED with c/o lower abdominal pain and left sided flank pain. Abd pain started x1 week ago, left flank pain started x1 day ago. Pt was seen on 5/15 and dx with UTI and started Macrobid. Associated symptoms include fever, dysuria, urinary urgency.

## 2022-02-15 LAB — URINE CULTURE: Culture: <10000 — AB

## 2022-03-24 ENCOUNTER — Encounter (HOSPITAL_BASED_OUTPATIENT_CLINIC_OR_DEPARTMENT_OTHER): Payer: Self-pay | Admitting: Emergency Medicine

## 2022-03-24 ENCOUNTER — Emergency Department (HOSPITAL_BASED_OUTPATIENT_CLINIC_OR_DEPARTMENT_OTHER)
Admission: EM | Admit: 2022-03-24 | Discharge: 2022-03-24 | Disposition: A | Discharge status: Home / Self Care | Payer: 59 | Attending: Student | Admitting: Student

## 2022-03-24 ENCOUNTER — Emergency Department (HOSPITAL_BASED_OUTPATIENT_CLINIC_OR_DEPARTMENT_OTHER): Payer: 59

## 2022-03-24 ENCOUNTER — Other Ambulatory Visit: Payer: Self-pay

## 2022-03-24 DIAGNOSIS — F17210 Nicotine dependence, cigarettes, uncomplicated: Secondary | ICD-10-CM | POA: Diagnosis not present

## 2022-03-24 DIAGNOSIS — N3090 Cystitis, unspecified without hematuria: Secondary | ICD-10-CM

## 2022-03-24 DIAGNOSIS — J449 Chronic obstructive pulmonary disease, unspecified: Secondary | ICD-10-CM | POA: Insufficient documentation

## 2022-03-24 DIAGNOSIS — R109 Unspecified abdominal pain: Secondary | ICD-10-CM | POA: Insufficient documentation

## 2022-03-24 DIAGNOSIS — R519 Headache, unspecified: Secondary | ICD-10-CM | POA: Diagnosis not present

## 2022-03-24 LAB — CBC WITH DIFFERENTIAL/PLATELET
Abs Immature Granulocytes: 0.1 10*3/uL — ABNORMAL HIGH (ref 0.00–0.07)
Basophils Absolute: 0 10*3/uL (ref 0.0–0.1)
Basophils Relative: 1 %
Eosinophils Absolute: 0.3 10*3/uL (ref 0.0–0.5)
Eosinophils Relative: 3 %
HCT: 43.5 % (ref 36.0–46.0)
Hemoglobin: 14.6 g/dL (ref 12.0–15.0)
Immature Granulocytes: 1 %
Lymphocytes Relative: 22 %
Lymphs Abs: 1.8 10*3/uL (ref 0.7–4.0)
MCH: 33.5 pg (ref 26.0–34.0)
MCHC: 33.6 g/dL (ref 30.0–36.0)
MCV: 99.8 fL (ref 80.0–100.0)
Monocytes Absolute: 0.9 10*3/uL (ref 0.1–1.0)
Monocytes Relative: 11 %
Neutro Abs: 4.9 10*3/uL (ref 1.7–7.7)
Neutrophils Relative %: 62 %
Platelets: 275 10*3/uL (ref 150–400)
RBC: 4.36 MIL/uL (ref 3.87–5.11)
RDW: 11.9 % (ref 11.5–15.5)
WBC: 7.9 10*3/uL (ref 4.0–10.5)
nRBC: 0 % (ref 0.0–0.2)

## 2022-03-24 LAB — URINALYSIS, ROUTINE W REFLEX MICROSCOPIC
Bilirubin Urine: NEGATIVE
Glucose, UA: NEGATIVE mg/dL
Hgb urine dipstick: NEGATIVE
Ketones, ur: NEGATIVE mg/dL
Nitrite: NEGATIVE
Specific Gravity, Urine: 1.022 (ref 1.005–1.030)
pH: 5.5 (ref 5.0–8.0)

## 2022-03-24 LAB — COMPREHENSIVE METABOLIC PANEL
ALT: 62 U/L — ABNORMAL HIGH (ref 0–44)
AST: 34 U/L (ref 15–41)
Albumin: 4.5 g/dL (ref 3.5–5.0)
Alkaline Phosphatase: 75 U/L (ref 38–126)
Anion gap: 10 (ref 5–15)
BUN: 21 mg/dL (ref 8–23)
CO2: 26 mmol/L (ref 22–32)
Calcium: 10.1 mg/dL (ref 8.9–10.3)
Chloride: 102 mmol/L (ref 98–111)
Creatinine, Ser: 0.94 mg/dL (ref 0.44–1.00)
GFR, Estimated: >60 mL/min (ref >60)
Glucose, Bld: 78 mg/dL (ref 70–99)
Potassium: 4 mmol/L (ref 3.5–5.1)
Sodium: 138 mmol/L (ref 135–145)
Total Bilirubin: 0.5 mg/dL (ref 0.3–1.2)
Total Protein: 7.1 g/dL (ref 6.5–8.1)

## 2022-03-24 MED ORDER — SODIUM CHLORIDE 0.9 % IV SOLN
2.0000 g | Freq: Three times a day (TID) | INTRAVENOUS | Status: DC
  Administered 2022-03-24: 2 g via INTRAVENOUS
  Filled 2022-03-24: qty 12.5

## 2022-03-24 MED ORDER — KETOROLAC TROMETHAMINE 15 MG/ML IJ SOLN
15.0000 mg | Freq: Once | INTRAMUSCULAR | Status: AC
  Administered 2022-03-24: 15 mg via INTRAVENOUS
  Filled 2022-03-24: qty 1

## 2022-03-24 MED ORDER — LACTATED RINGERS IV BOLUS
1000.0000 mL | Freq: Once | INTRAVENOUS | Status: AC
  Administered 2022-03-24: 1000 mL via INTRAVENOUS

## 2022-03-24 MED ORDER — PROCHLORPERAZINE EDISYLATE 10 MG/2ML IJ SOLN
10.0000 mg | Freq: Once | INTRAMUSCULAR | Status: AC
  Administered 2022-03-24: 10 mg via INTRAVENOUS
  Filled 2022-03-24: qty 2

## 2022-03-24 MED ORDER — DIPHENHYDRAMINE HCL 50 MG/ML IJ SOLN
25.0000 mg | Freq: Once | INTRAMUSCULAR | Status: AC
  Administered 2022-03-24: 25 mg via INTRAVENOUS
  Filled 2022-03-24: qty 1

## 2022-03-24 MED ORDER — IOHEXOL 300 MG/ML  SOLN
100.0000 mL | Freq: Once | INTRAMUSCULAR | Status: AC | PRN
  Administered 2022-03-24: 80 mL via INTRAVENOUS

## 2022-03-26 LAB — URINE CULTURE: Culture: NO GROWTH

## 2022-04-16 IMAGING — MR MR HEAD W/O CM
10 series · 48 of 48 positions shown · non-contrast
Comparison: None.

CLINICAL DATA: Memory loss and dizziness.  History of concussion.

EXAM:
MRI HEAD WITHOUT CONTRAST
TECHNIQUE: Multiplanar, multiecho pulse sequences of the brain and surrounding
structures were obtained without intravenous contrast.

[Series 2: T1 · sagittal · 5.0mm · 0.45mm/px · 3 of 21 slices shown]
[im 1/21]
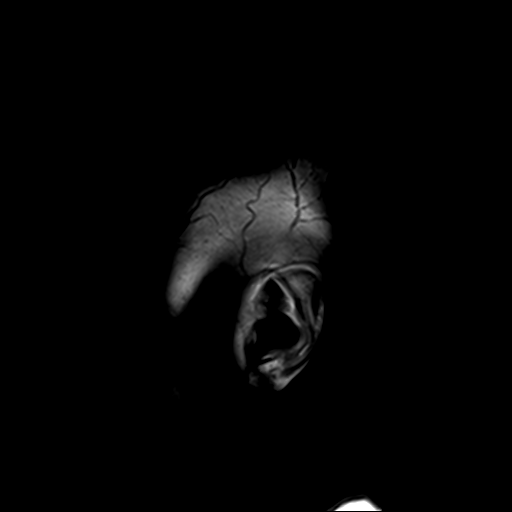
[im 11/21]
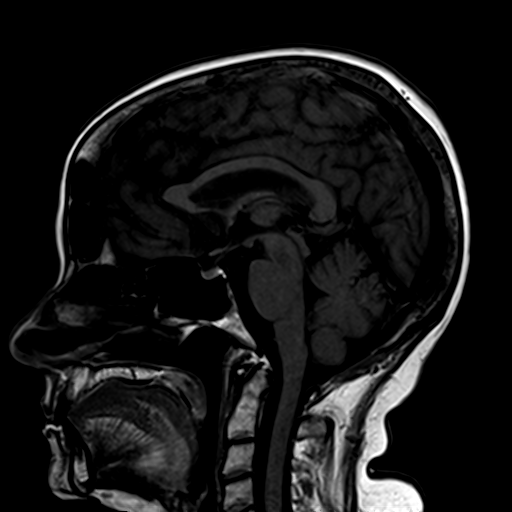
[im 21/21]
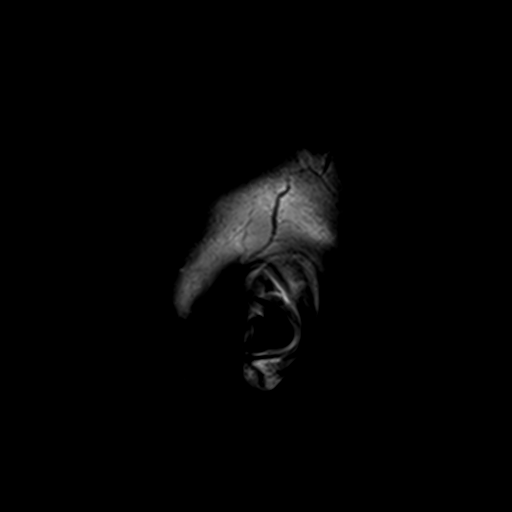

[Series 3: DWI · axial · 3.0mm · 1.80mm/px · z∈[-47,+100]mm · 9 of 100 slices shown (1 of 4)]
[im 1/100]
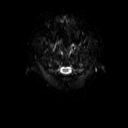
[im 13/100]
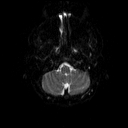
[im 25/100]
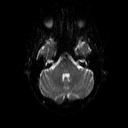
[im 38/100]
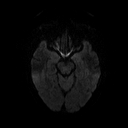
[im 50/100]
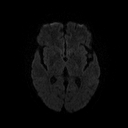
[im 62/100]
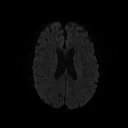
[im 75/100]
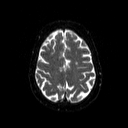
[im 87/100]
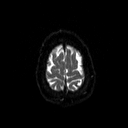
[im 100/100]
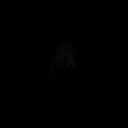

[Series 4: DWI · axial · 3.0mm · 1.80mm/px · z∈[-47,+100]mm · 4 of 48 slices shown (2 of 4)]
[im 1/48]
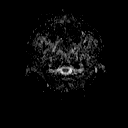
[im 16/48]
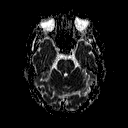
[im 32/48]
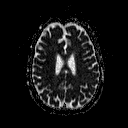
[im 48/48]
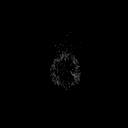

[Series 5: DWI · coronal · 5.0mm · 1.80mm/px · 6 of 66 slices shown (3 of 4)]
[im 1/66]
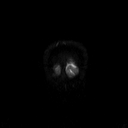
[im 14/66]
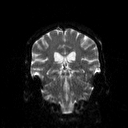
[im 27/66]
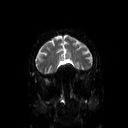
[im 40/66]
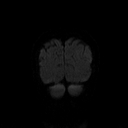
[im 53/66]
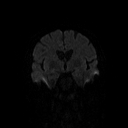
[im 66/66]
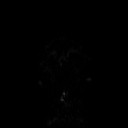

[Series 6: DWI · coronal · 5.0mm · 1.80mm/px · 3 of 34 slices shown (4 of 4)]
[im 1/34]
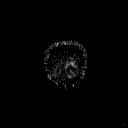
[im 17/34]
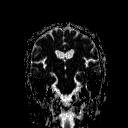
[im 34/34]
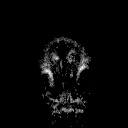

[Series 7: T2 · axial · 5.0mm · 0.51mm/px · z∈[-46,+101]mm · 2 of 22 slices shown (1 of 2)]
[im 1/22]
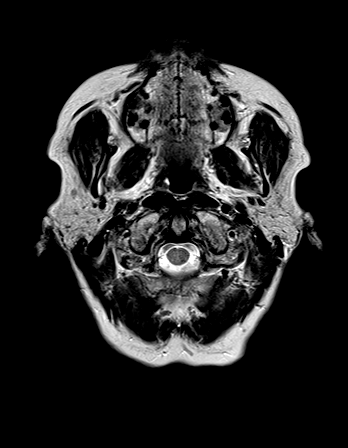
[im 22/22]
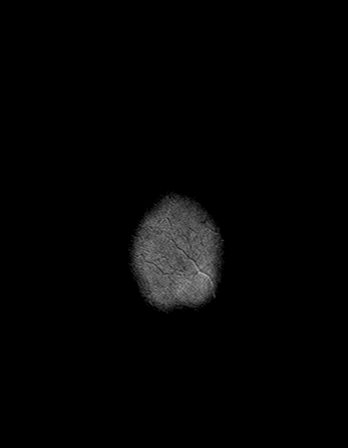

[Series 8: FLAIR · axial · 3.0mm · 0.45mm/px · z∈[-41,+94]mm · 3 of 30 slices shown]
[im 1/30]
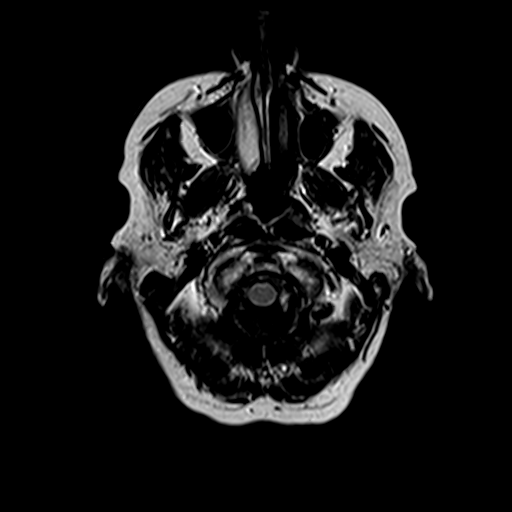
[im 15/30]
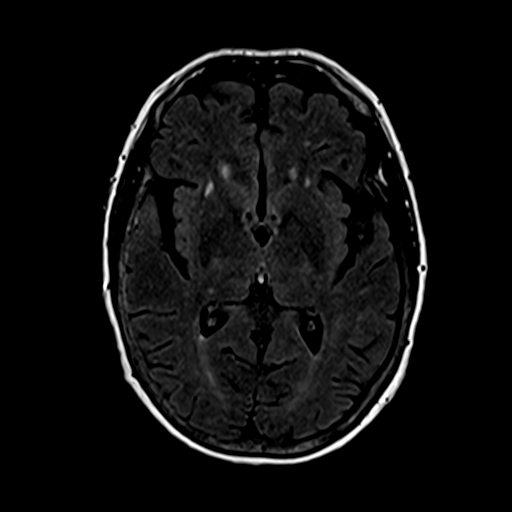
[im 30/30]
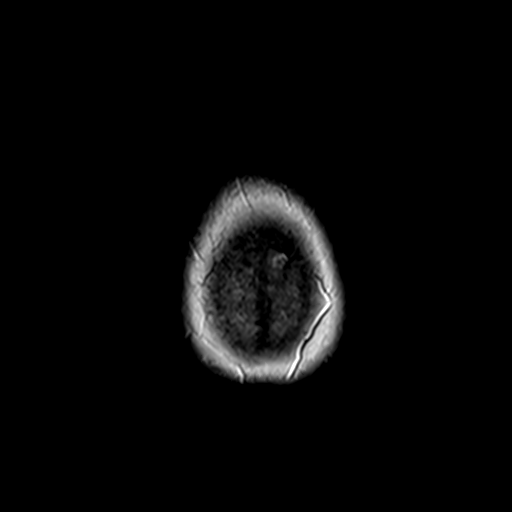

[Series 10: swi_images · axial · 4.0mm · 0.90mm/px · z∈[-43,+96]mm · 3 of 36 slices shown]
[im 1/36]
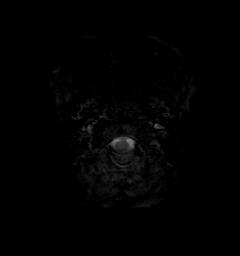
[im 18/36]
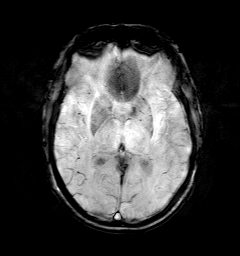
[im 36/36]
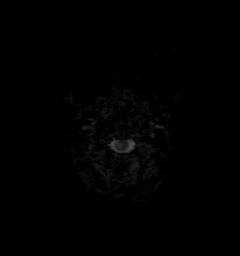

[Series 11: t1_mpr_tra · axial · 1.0mm · 0.71mm/px · z∈[-44,+99]mm · 13 of 144 slices shown]
[im 1/144]
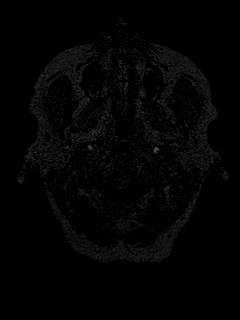
[im 12/144]
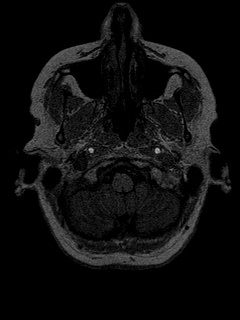
[im 24/144]
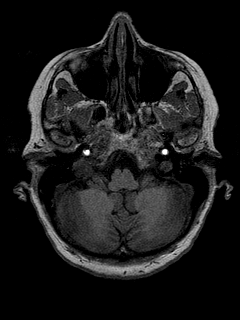
[im 36/144]
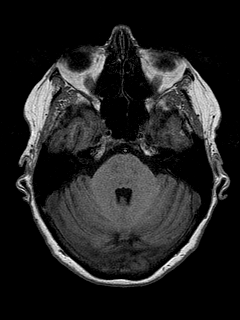
[im 48/144]
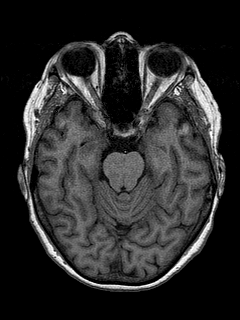
[im 60/144]
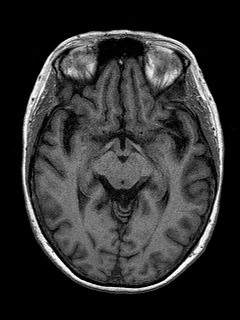
[im 72/144]
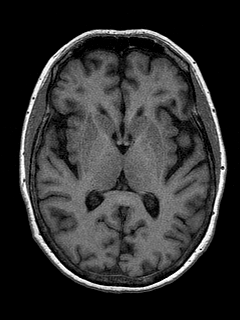
[im 84/144]
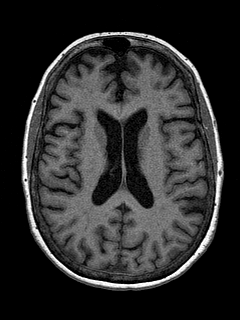
[im 96/144]
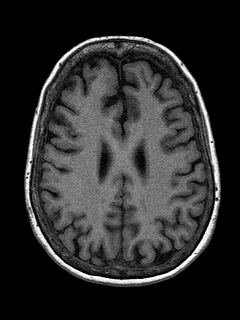
[im 108/144]
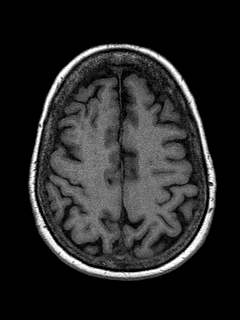
[im 120/144]
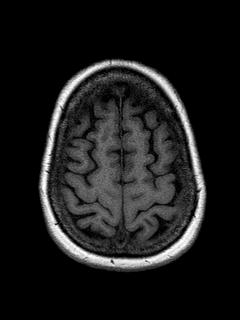
[im 132/144]
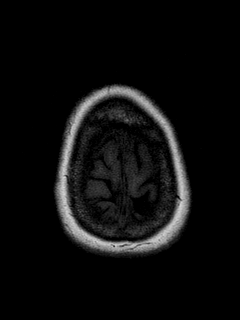
[im 144/144]
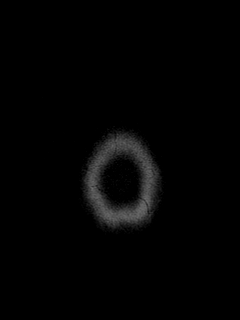

[Series 12: T2 · coronal · 5.0mm · 0.45mm/px · 2 of 25 slices shown (2 of 2)]
[im 1/25]
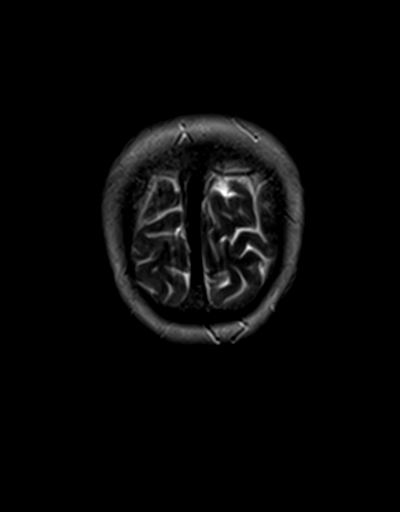
[im 25/25]
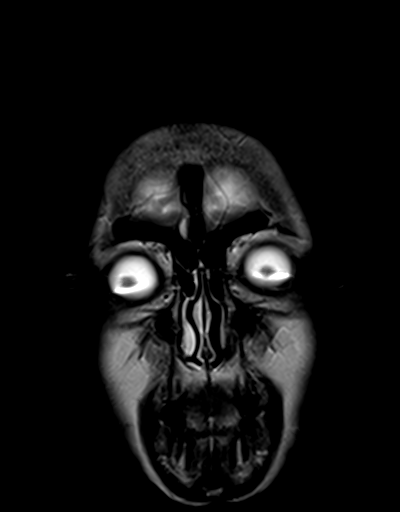

[48 of 48 positions shown; findings below may reference images not displayed]

FINDINGS: Brain: No acute infarct, acute hemorrhage or extra-axial collection.
Multifocal hyperintense T2-weighted signal within the white matter.
Normal volume of CSF spaces. No chronic microhemorrhage. Normal
midline structures.

Vascular: Normal flow voids.

Skull and upper cervical spine: Normal marrow signal.

Sinuses/Orbits: Paranasal sinuses are clear. No mastoid effusion.
Right optic disc coloboma.

Other: None
IMPRESSION: 1. No acute intracranial abnormality.
2. Multifocal hyperintense T2-weighted signal within the white
matter, most commonly due to chronic small vessel disease.

## 2022-05-10 ENCOUNTER — Encounter (HOSPITAL_COMMUNITY): Payer: Self-pay

## 2022-05-10 ENCOUNTER — Encounter (HOSPITAL_BASED_OUTPATIENT_CLINIC_OR_DEPARTMENT_OTHER): Payer: Self-pay | Admitting: Emergency Medicine

## 2022-05-10 ENCOUNTER — Observation Stay (HOSPITAL_BASED_OUTPATIENT_CLINIC_OR_DEPARTMENT_OTHER)
Admission: EM | Admit: 2022-05-10 | Discharge: 2022-05-12 | Disposition: A | Discharge status: Home / Self Care | Payer: Commercial Managed Care - HMO | Attending: Internal Medicine | Admitting: Internal Medicine

## 2022-05-10 ENCOUNTER — Other Ambulatory Visit: Payer: Self-pay

## 2022-05-10 DIAGNOSIS — K5900 Constipation, unspecified: Secondary | ICD-10-CM | POA: Insufficient documentation

## 2022-05-10 DIAGNOSIS — R945 Abnormal results of liver function studies: Secondary | ICD-10-CM | POA: Diagnosis not present

## 2022-05-10 DIAGNOSIS — R748 Abnormal levels of other serum enzymes: Secondary | ICD-10-CM | POA: Diagnosis present

## 2022-05-10 DIAGNOSIS — F17210 Nicotine dependence, cigarettes, uncomplicated: Secondary | ICD-10-CM | POA: Diagnosis not present

## 2022-05-10 DIAGNOSIS — N390 Urinary tract infection, site not specified: Secondary | ICD-10-CM | POA: Diagnosis present

## 2022-05-10 DIAGNOSIS — R1030 Lower abdominal pain, unspecified: Principal | ICD-10-CM | POA: Insufficient documentation

## 2022-05-10 DIAGNOSIS — Z79899 Other long term (current) drug therapy: Secondary | ICD-10-CM | POA: Diagnosis not present

## 2022-05-10 DIAGNOSIS — J449 Chronic obstructive pulmonary disease, unspecified: Secondary | ICD-10-CM | POA: Diagnosis not present

## 2022-05-10 DIAGNOSIS — R109 Unspecified abdominal pain: Secondary | ICD-10-CM | POA: Diagnosis present

## 2022-05-10 DIAGNOSIS — K219 Gastro-esophageal reflux disease without esophagitis: Secondary | ICD-10-CM | POA: Diagnosis present

## 2022-05-10 LAB — COMPREHENSIVE METABOLIC PANEL
ALT: 92 U/L — ABNORMAL HIGH (ref 0–44)
AST: 96 U/L — ABNORMAL HIGH (ref 15–41)
Albumin: 4.4 g/dL (ref 3.5–5.0)
Alkaline Phosphatase: 73 U/L (ref 38–126)
Anion gap: 12 (ref 5–15)
BUN: 21 mg/dL (ref 8–23)
CO2: 20 mmol/L — ABNORMAL LOW (ref 22–32)
Calcium: 9.7 mg/dL (ref 8.9–10.3)
Chloride: 104 mmol/L (ref 98–111)
Creatinine, Ser: 0.98 mg/dL (ref 0.44–1.00)
GFR, Estimated: >60 mL/min (ref >60)
Glucose, Bld: 95 mg/dL (ref 70–99)
Potassium: 4 mmol/L (ref 3.5–5.1)
Sodium: 136 mmol/L (ref 135–145)
Total Bilirubin: 0.9 mg/dL (ref 0.3–1.2)
Total Protein: 7.1 g/dL (ref 6.5–8.1)

## 2022-05-10 LAB — URINALYSIS, ROUTINE W REFLEX MICROSCOPIC
Bilirubin Urine: NEGATIVE
Glucose, UA: NEGATIVE mg/dL
Hgb urine dipstick: NEGATIVE
Ketones, ur: 15 mg/dL — AB
Nitrite: NEGATIVE
Protein, ur: NEGATIVE mg/dL
Specific Gravity, Urine: >1.03 — ABNORMAL HIGH (ref 1.005–1.030)
pH: 5.5 (ref 5.0–8.0)

## 2022-05-10 LAB — CBC
HCT: 40.3 % (ref 36.0–46.0)
Hemoglobin: 13.7 g/dL (ref 12.0–15.0)
MCH: 33.6 pg (ref 26.0–34.0)
MCHC: 34 g/dL (ref 30.0–36.0)
MCV: 98.8 fL (ref 80.0–100.0)
Platelets: 222 10*3/uL (ref 150–400)
RBC: 4.08 MIL/uL (ref 3.87–5.11)
RDW: 12.5 % (ref 11.5–15.5)
WBC: 7 10*3/uL (ref 4.0–10.5)
nRBC: 0 % (ref 0.0–0.2)

## 2022-05-10 LAB — URINALYSIS, MICROSCOPIC (REFLEX)

## 2022-05-10 LAB — LACTIC ACID, PLASMA: Lactic Acid, Venous: 0.9 mmol/L (ref 0.5–1.9)

## 2022-05-10 MED ORDER — OXYCODONE-ACETAMINOPHEN 5-325 MG PO TABS: 1.0000 | ORAL_TABLET | ORAL | Status: DC | PRN

## 2022-05-10 MED ORDER — OXYCODONE-ACETAMINOPHEN 5-325 MG PO TABS
2.0000 | ORAL_TABLET | ORAL | Status: DC | PRN
  Administered 2022-05-10 – 2022-05-11 (×2): 2 via ORAL
  Filled 2022-05-10 (×2): qty 2

## 2022-05-10 MED ORDER — SODIUM CHLORIDE 0.9 % IV SOLN
INTRAVENOUS | Status: AC
Start: 2022-05-11 — End: 2022-05-11

## 2022-05-10 MED ORDER — ONDANSETRON HCL 4 MG/2ML IJ SOLN
4.0000 mg | Freq: Once | INTRAMUSCULAR | Status: AC
  Administered 2022-05-10: 4 mg via INTRAVENOUS
  Filled 2022-05-10: qty 2

## 2022-05-10 MED ORDER — SODIUM CHLORIDE 0.9 % IV SOLN
1.0000 g | Freq: Once | INTRAVENOUS | Status: AC
  Administered 2022-05-10: 1 g via INTRAVENOUS
  Filled 2022-05-10: qty 10

## 2022-05-10 MED ORDER — FENTANYL CITRATE PF 50 MCG/ML IJ SOSY
50.0000 ug | PREFILLED_SYRINGE | Freq: Once | INTRAMUSCULAR | Status: AC
  Administered 2022-05-10: 50 ug via INTRAVENOUS
  Filled 2022-05-10: qty 1

## 2022-05-10 MED ORDER — SODIUM CHLORIDE 0.9 % IV BOLUS
1000.0000 mL | Freq: Once | INTRAVENOUS | Status: AC
  Administered 2022-05-10: 1000 mL via INTRAVENOUS

## 2022-05-10 NOTE — Progress Notes (Signed)
Allison Le is a 61 y.o. female with PMH COPD, recurrent UTIs failed bactrim, was started on ciprofloxacin yesterday by urology but symptomatic now, confused at times, transfer from [facility] for admission for iv abx. Ucx done at [facility].

## 2022-05-10 NOTE — ED Triage Notes (Signed)
Pt  here from home with c/o abd pain back pain , was just switched to Cipro for an UTI  , pt states that she had some sob and hallucinations last night that she thinks that is related to her  UTI ( saw a black wolf with a rat in its mouth )

## 2022-05-10 NOTE — ED Provider Notes (Signed)
[facility] EMERGENCY DEPT Provider Note   CSN: [id number] Arrival date & time: 05/10/22  1556     History  Chief Complaint  Patient presents with   Urinary Tract Infection    Allison Le is a 61 y.o. female with history of COPD, IBS, hyperlipidemia, depression, anxiety, GERD, migraines.  Returning to the ED today for persistent UTI.  Has been ongoing since May, continues to be resistant to outpatient antibiotic treatment.  Symptomatic with dysuria, increased frequency, difficulty urinating, and significant unchanged abdominal pain.  Recently switched to ciprofloxacin from Bactrim yesterday, has taken 2 days worth.  Was last seen in the ED 03/24/2022, was recommended for admission due to continual failure of outpatient antibiotic therapy, however refused at that time.  Denies fevers, but endorses chills, hallucinations, and confusion.  Continued bilateral abdominal pain.  Hx of hysterectomy, denies possibility of pregnancy.  Daily tobacco use.  The history is provided by the patient and medical records.  Urinary Tract Infection      Home Medications Prior to Admission medications   Medication Sig Start Date End Date Taking? Authorizing Provider  Ascorbic Acid (VITAMIN C PO) Take 2 tablets by mouth daily.    [provider]  Aspirin-Acetaminophen-Caffeine (GOODY HEADACHE PO) Take 1 packet by mouth as needed (pain,headache).    [provider]  b complex vitamins tablet Take 1 tablet by mouth daily.    [provider]  buPROPion (WELLBUTRIN XL) 300 MG 24 hr tablet Take 300 mg by mouth daily. 11/13/16   [provider]  cetirizine (ZYRTEC ALLERGY) 10 MG tablet Take 1 tablet (10 mg total) by mouth daily. 01/16/18   Law, Waylan Boga, PA-C  cholecalciferol (VITAMIN D3) 25 MCG (1000 UT) tablet Take 1,000 Units by mouth daily.    [provider]  clonazePAM (KLONOPIN) 0.5 MG tablet Take 0.5 mg by mouth 2 (two) times daily as needed  for anxiety. 12/09/16   [provider]  dicyclomine (BENTYL) 20 MG tablet Take 1 tablet (20 mg total) by mouth 2 (two) times daily. 05/29/17   Kellie Shropshire, PA-C  FLUoxetine (PROZAC) 40 MG capsule Take 40 mg by mouth daily. 11/13/16   [provider]  gabapentin (NEURONTIN) 600 MG tablet TAKE 1 TABLET BY MOUTH TWICE A DAY 11/16/21   Everlena Cooper, Adam R, DO  HYDROcodone-acetaminophen (NORCO/VICODIN) 5-325 MG tablet Take 1 tablet by mouth every 4 (four) hours as needed. 03/29/19   Jacalyn Lefevre, MD  ibuprofen (ADVIL,MOTRIN) 200 MG tablet Take 400-600 mg by mouth every 6 (six) hours as needed.    [provider]  meclizine (ANTIVERT) 25 MG tablet Take 1 tablet (25 mg total) by mouth 3 (three) times daily as needed for dizziness. 09/21/20   Drema Dallas, DO  Multiple Vitamin (MULTIVITAMIN) capsule Take 1 capsule by mouth daily.    [provider]  ondansetron (ZOFRAN ODT) 8 MG disintegrating tablet Take 1 tablet (8 mg total) by mouth every 8 (eight) hours as needed for nausea or vomiting. Patient not taking: Reported on 09/05/2021 01/28/17   Jaynie Crumble, PA-C  ondansetron (ZOFRAN) 4 MG tablet Take 1 tablet (4 mg total) by mouth daily as needed for up to 10 doses for nausea or vomiting. Patient not taking: Reported on 09/05/2021 09/04/19   Garnette Gunner, MD  QUEtiapine (SEROQUEL) 25 MG tablet TAKE 1 TABLET BY MOUTH EVERYDAY AT BEDTIME 09/27/21   Ranelle Oyster, MD  topiramate (TOPAMAX) 25 MG tablet TAKE 1 TABLET BY  MOUTH EVERYDAY AT BEDTIME 10/16/21   Ranelle Oyster, MD  Zinc 100 MG TABS Take 100 mg by mouth daily.    [provider]      Allergies    Sumatriptan, Zolpidem, Morphine and related, Nitrofurantoin, Tramadol, and Trazodone    Review of Systems   Review of Systems  Genitourinary:  Positive for difficulty urinating, dysuria, frequency and urgency.    Physical Exam Updated Vital Signs BP 129/63   Pulse 87   Temp 98.1 F (36.7  C) (Oral)   Resp 18   SpO2 97%  Physical Exam Vitals and nursing note reviewed.  Constitutional:      General: She is not in acute distress.    Appearance: Normal appearance. She is well-developed. She is not ill-appearing, toxic-appearing or diaphoretic.     Comments: Appears mildly clinically dehydrated  HENT:     Head: Normocephalic and atraumatic.  Eyes:     General: No scleral icterus.    Conjunctiva/sclera: Conjunctivae normal.  Neck:     Comments: Very supple on exam, no meningismus. Cardiovascular:     Rate and Rhythm: Normal rate and regular rhythm.     Pulses: Normal pulses.     Heart sounds: No murmur heard. Pulmonary:     Effort: Pulmonary effort is normal. No respiratory distress.     Breath sounds: Normal breath sounds. No wheezing.  Chest:     Chest wall: No tenderness.  Abdominal:     Palpations: Abdomen is soft.     Tenderness: There is abdominal tenderness (Bilateral abdominal tenderness, left worse than right.  Also with umbilical tenderness).  Musculoskeletal:        General: No swelling.     Cervical back: Neck supple. No rigidity.     Right lower leg: No edema.     Left lower leg: No edema.  Skin:    General: Skin is warm and dry.     Capillary Refill: Capillary refill takes less than 2 seconds.     Coloration: Skin is not jaundiced or pale.     Findings: No erythema.  Neurological:     Mental Status: She is alert and oriented to person, place, and time.  Psychiatric:        Mood and Affect: Mood normal.     ED Results / Procedures / Treatments   Labs (all labs ordered are listed, but only abnormal results are displayed) Labs Reviewed  COMPREHENSIVE METABOLIC PANEL - Abnormal; Notable for the following components:      Result Value   CO2 20 (*)    AST 96 (*)    ALT 92 (*)    All other components within normal limits  URINALYSIS, ROUTINE W REFLEX MICROSCOPIC - Abnormal; Notable for the following components:   Color, Urine ORANGE (*)     Specific Gravity, Urine >1.030 (*)    Ketones, ur 15 (*)    Leukocytes,Ua SMALL (*)    All other components within normal limits  URINALYSIS, MICROSCOPIC (REFLEX) - Abnormal; Notable for the following components:   Bacteria, UA RARE (*)    Non Squamous Epithelial PRESENT (*)    All other components within normal limits  URINE CULTURE  CBC  LACTIC ACID, PLASMA    EKG None  Radiology No results found.  Procedures Procedures    Medications Ordered in ED Medications  sodium chloride 0.9 % bolus 1,000 mL (0 mLs Intravenous Stopped 05/10/22 2133)  fentaNYL (SUBLIMAZE) injection 50 mcg (50 mcg Intravenous  Given 05/10/22 2023)  ondansetron [facility]) injection 4 mg (4 mg Intravenous Given 05/10/22 2023)  cefTRIAXone (ROCEPHIN) 1 g in sodium chloride 0.9 % 100 mL IVPB (0 g Intravenous Stopped 05/10/22 2146)    ED Course/ Medical Decision Making/ A&P                            Medical Decision Making Amount and/or Complexity of Data Reviewed Labs: ordered. Decision-making details documented in ED Course.  Risk Prescription drug management. Decision regarding hospitalization.   61 y.o. female presents to the ED for concern of Urinary Tract Infection     This involves an extensive number of treatment options, and is a complaint that carries with it a high risk of complications and morbidity.     Past Medical History / Co-morbidities / Social History: Hx of COPD, IBS, hyperlipidemia, depression, anxiety, GERD, migraines Social Determinants of Health include daily tobacco use, for cessation counseling was provided  Additional History:  Internal and external records from outside source obtained and reviewed including ED visits, urgent care, family medicine.  See for reference.  Unable to review urology notes.  Lab Tests: I ordered, and personally interpreted labs.  The pertinent results include:   CBC: Unremarkable CMP/BMP: Mild transaminitis. Urinalysis indicative of infection,  WBC 11-20, rare bacteria, leukocytes present  Imaging Studies: None  ED Course / Critical Interventions: Pt well-appearing on exam.  Presenting today with persistent UTI since May 2023.  Has tried multiple courses of outpatient antibiotic therapy with no success.  Symptomatic with dysuria, increased frequency, difficulty urinating.  Recently switched to ciprofloxacin from Bactrim yesterday due to apparent pseudomonal presence, has taken 2 days worth.  Was last seen in the ED 03/24/2022, for which she was recommended for admission due to continual failure of outpatient antibiotic therapy, however refused at that time.  Without vaginal symptoms or changes in bowel habits.  Today, patient is willing to be admitted for further treatment, stating "I cannot keep living like this".  Denies fevers, but endorses chills and recent hallucinations.  Continued bilateral abdominal pain.  Hx of hysterectomy, denies possibility of pregnancy.  No changes in bowel habits.  Fluid bolus, fentanyl, and Zofran provided.  Plan for admission. I consulted with Dr. Kurtis Bushman of hospitalist group.  Discussed pt at length.  Plans to accept pt for admission  I have reviewed the patients home medicines and have made adjustments as needed.  Disposition: Admission  This chart was dictated using voice recognition software.  Despite best efforts to proofread, errors can occur which can change the documentation meaning.         Final Clinical Impression(s) / ED Diagnoses Final diagnoses:  Urinary tract infection without hematuria, site unspecified    Rx / DC Orders ED Discharge Orders     None         Candace Cruise [id number]    Fredia Sorrow, MD 05/23/22 1757

## 2022-05-11 ENCOUNTER — Encounter (HOSPITAL_COMMUNITY): Payer: Self-pay | Admitting: Internal Medicine

## 2022-05-11 ENCOUNTER — Observation Stay (HOSPITAL_COMMUNITY): Payer: Commercial Managed Care - HMO

## 2022-05-11 DIAGNOSIS — K219 Gastro-esophageal reflux disease without esophagitis: Secondary | ICD-10-CM

## 2022-05-11 DIAGNOSIS — R748 Abnormal levels of other serum enzymes: Secondary | ICD-10-CM | POA: Diagnosis not present

## 2022-05-11 DIAGNOSIS — K5900 Constipation, unspecified: Secondary | ICD-10-CM

## 2022-05-11 DIAGNOSIS — R109 Unspecified abdominal pain: Secondary | ICD-10-CM

## 2022-05-11 LAB — CK: Total CK: 47 U/L (ref 38–234)

## 2022-05-11 LAB — PROCALCITONIN: Procalcitonin: <0.1 ng/mL

## 2022-05-11 LAB — HEPATITIS PANEL, ACUTE
HCV Ab: NONREACTIVE
Hep A IgM: NONREACTIVE
Hep B C IgM: NONREACTIVE
Hepatitis B Surface Ag: NONREACTIVE

## 2022-05-11 LAB — HIV ANTIBODY (ROUTINE TESTING W REFLEX): HIV Screen 4th Generation wRfx: NONREACTIVE

## 2022-05-11 LAB — C-REACTIVE PROTEIN: CRP: 0.6 mg/dL (ref <1.0)

## 2022-05-11 MED ORDER — IOHEXOL 300 MG/ML  SOLN
100.0000 mL | Freq: Once | INTRAMUSCULAR | Status: AC | PRN
  Administered 2022-05-11: 100 mL via INTRAVENOUS

## 2022-05-11 MED ORDER — CIPROFLOXACIN IN D5W 400 MG/200ML IV SOLN
400.0000 mg | Freq: Two times a day (BID) | INTRAVENOUS | Status: DC
  Administered 2022-05-11 – 2022-05-12 (×3): 400 mg via INTRAVENOUS
  Filled 2022-05-11 (×3): qty 200

## 2022-05-11 MED ORDER — PANTOPRAZOLE SODIUM 40 MG PO TBEC
40.0000 mg | DELAYED_RELEASE_TABLET | Freq: Every day | ORAL | Status: DC
  Administered 2022-05-11 – 2022-05-12 (×2): 40 mg via ORAL
  Filled 2022-05-11 (×2): qty 1

## 2022-05-11 MED ORDER — OXYCODONE-ACETAMINOPHEN 5-325 MG PO TABS: 1.0000 | ORAL_TABLET | Freq: Three times a day (TID) | ORAL | Status: DC | PRN

## 2022-05-11 MED ORDER — BUPROPION HCL ER (XL) 300 MG PO TB24
300.0000 mg | ORAL_TABLET | Freq: Every day | ORAL | Status: DC
  Administered 2022-05-11 – 2022-05-12 (×2): 300 mg via ORAL
  Filled 2022-05-11 (×2): qty 1

## 2022-05-11 MED ORDER — ONDANSETRON HCL 4 MG PO TABS: 4.0000 mg | ORAL_TABLET | Freq: Four times a day (QID) | ORAL | Status: DC | PRN

## 2022-05-11 MED ORDER — ONDANSETRON HCL 4 MG/2ML IJ SOLN: 4.0000 mg | Freq: Four times a day (QID) | INTRAMUSCULAR | Status: DC | PRN

## 2022-05-11 MED ORDER — OXYCODONE-ACETAMINOPHEN 5-325 MG PO TABS
1.0000 | ORAL_TABLET | Freq: Three times a day (TID) | ORAL | Status: AC | PRN
  Administered 2022-05-11 (×2): 1 via ORAL
  Filled 2022-05-11 (×2): qty 1

## 2022-05-11 MED ORDER — FLUOXETINE HCL 20 MG PO CAPS
40.0000 mg | ORAL_CAPSULE | Freq: Every day | ORAL | Status: DC
  Administered 2022-05-11 – 2022-05-12 (×2): 40 mg via ORAL
  Filled 2022-05-11 (×2): qty 2

## 2022-05-11 MED ORDER — SENNA 8.6 MG PO TABS
2.0000 | ORAL_TABLET | Freq: Every day | ORAL | Status: DC
  Administered 2022-05-11: 17.2 mg via ORAL
  Filled 2022-05-11: qty 2

## 2022-05-11 MED ORDER — ACETAMINOPHEN 325 MG PO TABS: 650.0000 mg | ORAL_TABLET | Freq: Four times a day (QID) | ORAL | Status: DC | PRN

## 2022-05-11 MED ORDER — POLYETHYLENE GLYCOL 3350 17 G PO PACK
17.0000 g | PACK | Freq: Every day | ORAL | Status: DC
  Administered 2022-05-11 – 2022-05-12 (×2): 17 g via ORAL
  Filled 2022-05-11 (×2): qty 1

## 2022-05-11 MED ORDER — POLYETHYLENE GLYCOL 3350 17 G PO PACK: 17.0000 g | PACK | Freq: Every day | ORAL | Status: DC | PRN

## 2022-05-11 MED ORDER — ACETAMINOPHEN 650 MG RE SUPP: 650.0000 mg | Freq: Four times a day (QID) | RECTAL | Status: DC | PRN

## 2022-05-11 MED ORDER — ALBUTEROL SULFATE (2.5 MG/3ML) 0.083% IN NEBU: 2.5000 mg | INHALATION_SOLUTION | RESPIRATORY_TRACT | Status: DC | PRN

## 2022-05-11 MED ORDER — ENOXAPARIN SODIUM 40 MG/0.4ML IJ SOSY
40.0000 mg | PREFILLED_SYRINGE | INTRAMUSCULAR | Status: DC
  Administered 2022-05-11 – 2022-05-12 (×2): 40 mg via SUBCUTANEOUS
  Filled 2022-05-11 (×2): qty 0.4

## 2022-05-11 MED ORDER — MIRABEGRON ER 25 MG PO TB24
25.0000 mg | ORAL_TABLET | Freq: Every day | ORAL | Status: DC
  Administered 2022-05-11 – 2022-05-12 (×2): 25 mg via ORAL
  Filled 2022-05-11 (×3): qty 1

## 2022-05-11 NOTE — Assessment & Plan Note (Signed)
   Patient voices concern that her transaminases are minimally elevated  CT imaging reveals no evidence of cirrhosis  At patient request, will obtain hepatitis panel  We will additionally obtain CK, expect this will be normal  Patient denies heavy alcohol use  At this point I doubt significant clinical significance of these mildly elevated transaminases

## 2022-05-11 NOTE — Assessment & Plan Note (Signed)
   As mentioned above, evidence of substantial constipation noted on CT imaging of the abdomen and pelvis  Placing patient on scheduled MiraLAX and senna  Can be transition to suppositories or enemas if this is unsuccessful  Cutting back on patient's regimen of narcotics which can hopefully be quickly discontinued altogether  Hopeful that resolution of patient's constipation will result in significant symptomatic relief

## 2022-05-11 NOTE — Progress Notes (Signed)
Brief same-day note  Patient is a 61 year old female with history of COPD, GERD, depression, hyperlipidemia, recurrent UTI who presented to med Center at [facility] with complaint of abdominal pain, back pain.  Patient had been treated with 2 courses of antibiotics for recurrent UTI recently.  Her last urine culture grew Pseudomonas and was placed on ciprofloxacin but no improvement in the symptoms and she was seen by [facility].  After taking 2 days ciprofloxacin, she was weak, had ongoing lower abdominal/back pain, dysuria.  Also reported visual hallucinations. Patient seen and examined at bedside this morning.  During my evaluation, she looks comfortable, hemodynamically stable.  Afebrile.  She looks alert and will oriented.  Complains of some lower abdominal discomfort.  Feeling better.   Assessment and plan:  Recurrent UTI: Last urine culture grew Pseudomonas as per report.  Based on previous culture, restarted on ciprofloxacin here.  Urology consulted.  We will follow-up the urine culture sent here.  CT imaging did not show any urinary tract anatomical abnormality, no evidence of pyelonephritis or cystitis. Gentle IV fluids.  Constipation: Continue bowel regimen  Generalized weakness: We will continue with gentle hydration.  Will order PT/OT when appropriate if needed.  Elevated liver enzymes: Unclear etiology.  We will continue to monitor.  Checking hepatitis panel, CK  History of depression/anxiety: On Prozac, clonazepam also on Seroquel

## 2022-05-11 NOTE — Assessment & Plan Note (Signed)
   Daily PPI 

## 2022-05-11 NOTE — Assessment & Plan Note (Addendum)
   Patient presenting with 69-month history of ongoing abdominal discomfort, dysuria, low back pain, generalized weakness, lightheadedness and no visual hallucinations  This is occurring despite being placed on what seems to be upwards of 6 rounds of antibiotic therapy  I have obtained CT imaging of the abdomen pelvis with contrast to determine if there is any evidence of anatomical abnormality or severe unrelenting infection.  The CT scan reveals no evidence of urinary tract anatomical abnormality, pyelonephritis or cystitis.  Patient is following with [facility] and reports that she has recently been placed on ciprofloxacin.  Patient is already received 2 days of this medication.  I will order an additional 3 days of ciprofloxacin at this time.  Case has been discussed with Dr. Mena Goes with urology.  After discussing the CT results he does not feel that urgent urologic evaluation/intervention is necessary in the absence of an anatomical abnormality.  He agrees with completion of the current course of antibiotics but suspects that the majority the patient's symptomatic relief will come from treating her the patient and placing her on Myrbetriq  Hydrating patient with intravenous isotonic fluids for associated volume depletion  Monitoring for symptomatic improvement  Urine and blood cultures have been ordered although I expect these to have no growth.
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
## 2022-05-11 NOTE — Assessment & Plan Note (Signed)
   No evidence of COPD exacerbation this time  As needed bronchodilator therapy for episodic shortness of breath and wheezing.  

## 2022-05-12 DIAGNOSIS — R109 Unspecified abdominal pain: Secondary | ICD-10-CM | POA: Diagnosis not present

## 2022-05-12 LAB — CBC WITH DIFFERENTIAL/PLATELET
Abs Immature Granulocytes: 0.03 10*3/uL (ref 0.00–0.07)
Basophils Absolute: 0 10*3/uL (ref 0.0–0.1)
Basophils Relative: 1 %
Eosinophils Absolute: 0.3 10*3/uL (ref 0.0–0.5)
Eosinophils Relative: 5 %
HCT: 35.6 % — ABNORMAL LOW (ref 36.0–46.0)
Hemoglobin: 11.7 g/dL — ABNORMAL LOW (ref 12.0–15.0)
Immature Granulocytes: 1 %
Lymphocytes Relative: 25 %
Lymphs Abs: 1.3 10*3/uL (ref 0.7–4.0)
MCH: 33.6 pg (ref 26.0–34.0)
MCHC: 32.9 g/dL (ref 30.0–36.0)
MCV: 102.3 fL — ABNORMAL HIGH (ref 80.0–100.0)
Monocytes Absolute: 0.6 10*3/uL (ref 0.1–1.0)
Monocytes Relative: 12 %
Neutro Abs: 3 10*3/uL (ref 1.7–7.7)
Neutrophils Relative %: 56 %
Platelets: 178 10*3/uL (ref 150–400)
RBC: 3.48 MIL/uL — ABNORMAL LOW (ref 3.87–5.11)
RDW: 12.5 % (ref 11.5–15.5)
WBC: 5.1 10*3/uL (ref 4.0–10.5)
nRBC: 0 % (ref 0.0–0.2)

## 2022-05-12 LAB — COMPREHENSIVE METABOLIC PANEL
ALT: 75 U/L — ABNORMAL HIGH (ref 0–44)
AST: 50 U/L — ABNORMAL HIGH (ref 15–41)
Albumin: 3.5 g/dL (ref 3.5–5.0)
Alkaline Phosphatase: 57 U/L (ref 38–126)
Anion gap: 6 (ref 5–15)
BUN: 16 mg/dL (ref 8–23)
CO2: 27 mmol/L (ref 22–32)
Calcium: 8.9 mg/dL (ref 8.9–10.3)
Chloride: 108 mmol/L (ref 98–111)
Creatinine, Ser: 0.89 mg/dL (ref 0.44–1.00)
GFR, Estimated: >60 mL/min (ref >60)
Glucose, Bld: 108 mg/dL — ABNORMAL HIGH (ref 70–99)
Potassium: 4.2 mmol/L (ref 3.5–5.1)
Sodium: 141 mmol/L (ref 135–145)
Total Bilirubin: 0.7 mg/dL (ref 0.3–1.2)
Total Protein: 6 g/dL — ABNORMAL LOW (ref 6.5–8.1)

## 2022-05-12 LAB — URINE CULTURE: Culture: NO GROWTH

## 2022-05-12 LAB — MAGNESIUM: Magnesium: 2 mg/dL (ref 1.7–2.4)

## 2022-05-12 MED ORDER — OXYCODONE-ACETAMINOPHEN 5-325 MG PO TABS
1.0000 | ORAL_TABLET | Freq: Three times a day (TID) | ORAL | Status: DC | PRN
Start: 2022-05-12 — End: 2022-05-12

## 2022-05-12 MED ORDER — OXYCODONE-ACETAMINOPHEN 5-325 MG PO TABS
1.0000 | ORAL_TABLET | Freq: Once | ORAL | Status: AC
  Administered 2022-05-12: 1 via ORAL
  Filled 2022-05-12: qty 1

## 2022-05-12 MED ORDER — CIPROFLOXACIN HCL 500 MG PO TABS: 500.0000 mg | ORAL_TABLET | Freq: Two times a day (BID) | ORAL | Status: DC

## 2022-05-12 NOTE — Progress Notes (Signed)
Discharge instructions gone over with Allison Le, she states she's going to follow up with  her urologist. Next medications due are written on her discharge papers. No prescriptions to pick up. Allison Le has cipro pills at home. Patient was discharged via wheelchair to her cousin.

## 2022-05-12 NOTE — Progress Notes (Signed)
  Transition of Care [facility]) Screening Note   Patient Details  Name: Allison Le Date of Birth: [birth year]   Transition of Care [facility]) CM/SW Contact:    Amada Jupiter, LCSW Phone Number: 05/12/2022, 10:48 AM    Transition of Care Department [facility]) has reviewed patient and no TOC needs have been identified at this time. We will continue to monitor patient advancement through interdisciplinary progression rounds. If new patient transition needs arise, please place a TOC consult.

## 2022-05-12 NOTE — Discharge Summary (Signed)
Physician Discharge Summary  Allison Le KYH:[id number] DOB: 1961-01-30 DOA: 05/10/2022  PCP: Clemencia Course, MD  Admit date: 05/10/2022 Discharge date: 05/12/2022  Admitted From: Home Disposition:  Home  Discharge Condition:Stable CODE STATUS:FULL, Diet recommendation:  Regular  Brief/Interim Summary:  Patient is a 61 year old female with history of COPD, GERD, depression, hyperlipidemia, recurrent UTI who presented to med Center at [facility] with complaint of abdominal pain, back pain.  Patient had been treated with 2 courses of antibiotics for recurrent UTI recently.  Her last urine culture grew Pseudomonas and was placed on ciprofloxacin but no improvement in the symptoms and she was seen by [facility].  After taking 2 days ciprofloxacin, she was weak, had ongoing lower abdominal/back pain, dysuria.  Also reported visual hallucinations. Patient was admitted and managed for UTI.  She was ordered on IV ciprofloxacin. Hospital course remained stable.  She remained afebrile.  Her urine culture did not show any growth.  She is medically stable for discharge home today and completed 7 days course of ciprofloxacin at home.  Following problems were addressed during her hospitalization:  Recurrent UTI: Last urine culture grew Pseudomonas as per report.  Based on previous culture, restarted on ciprofloxacin here.   CT imaging did not show any urinary tract anatomical abnormality, no evidence of pyelonephritis or cystitis. Patient feels better.  Urine culture did not show any growth.  She has been instructed to complete 7 days course of ciprofloxacin, she already has pills at home.  We recommend to follow-up with urology   Constipation: Was on bowel regimen   Generalized weakness: Better now.  IV fluids stopped.   Elevated liver enzymes: Unclear etiology.  Improved.  Normal hepatitis panel, CK  History of depression/anxiety: On Prozac, clonazepam also on Seroquel   Discharge  Diagnoses:  Principal Problem:   Abdominal pain Active Problems:   Constipation   Chronic obstructive pulmonary disease   Abnormal liver enzymes   Gastro-esophageal reflux disease without esophagitis    Discharge Instructions  Discharge Instructions     Diet general   Complete by: As directed    Discharge instructions   Complete by: As directed    1)Please take your medications as instructed 2)Follow up with your PCP, urologist as an outpatient   Increase activity slowly   Complete by: As directed       Allergies as of 05/12/2022       Reactions   Sumatriptan Nausea Only, Other (See Comments)   Chest pain   Zolpidem Rash, Other (See Comments)   Nightmares, sleep walking   Morphine And Related Nausea And Vomiting   Nitrofurantoin Other (See Comments)   GI upset.   Tramadol Nausea And Vomiting   Trazodone Other (See Comments)   nightmares        Medication List     STOP taking these medications    cetirizine 10 MG tablet Commonly known as: ZyrTEC Allergy   dicyclomine 20 MG tablet Commonly known as: BENTYL   gabapentin 600 MG tablet Commonly known as: NEURONTIN   meclizine 25 MG tablet Commonly known as: ANTIVERT   ondansetron 4 MG tablet Commonly known as: Zofran   ondansetron 8 MG disintegrating tablet Commonly known as: Zofran ODT   QUEtiapine 25 MG tablet Commonly known as: SEROQUEL   sulfamethoxazole-trimethoprim 800-160 MG tablet Commonly known as: BACTRIM DS       TAKE these medications    buPROPion 300 MG 24 hr tablet Commonly known as: WELLBUTRIN XL Take 300 mg  by mouth daily.   cholecalciferol 25 MCG (1000 UNIT) tablet Commonly known as: VITAMIN D3 Take 1,000 Units by mouth daily.   ciprofloxacin 500 MG tablet Commonly known as: CIPRO Take 1 tablet (500 mg total) by mouth 2 (two) times daily. Started on 05/09/22. Continue this medication for more 4 more days then stop What changed: additional instructions   clonazePAM  0.5 MG tablet Commonly known as: KLONOPIN Take 0.5 mg by mouth 2 (two) times daily as needed for anxiety.   cyanocobalamin 1000 MCG/ML injection Commonly known as: VITAMIN B12 Inject 1,000 mcg into the skin every 30 (thirty) days.   FLUoxetine 40 MG capsule Commonly known as: PROZAC Take 80 mg by mouth daily.   HYDROcodone-acetaminophen 5-325 MG tablet Commonly known as: NORCO/VICODIN Take 1 tablet by mouth every 4 (four) hours as needed. What changed: reasons to take this   ibuprofen 200 MG tablet Commonly known as: ADVIL Take 400-600 mg by mouth every 6 (six) hours as needed.   methylphenidate 5 MG tablet Commonly known as: RITALIN Take 5 mg by mouth daily.   methylphenidate 18 MG CR tablet Commonly known as: CONCERTA Take 18 mg by mouth every morning.   multivitamin capsule Take 1 capsule by mouth daily.   Polyethylene Glycol 400 0.25 % Soln Apply 1 drop to eye daily.   Premarin vaginal cream Generic drug: conjugated estrogens Place 0.5 mg vaginally in the morning and at bedtime.   triamcinolone cream 0.1 % Commonly known as: KENALOG Apply 1 Application topically daily as needed.        Follow-up Information     Messer, Gaylan Gerold, MD. Schedule an appointment as soon as possible for a visit in 1 week(s).   Specialty: Internal Medicine Contact information: [address] [place] [address] [phone]                Allergies  Allergen Reactions   Sumatriptan Nausea Only and Other (See Comments)    Chest pain   Zolpidem Rash and Other (See Comments)    Nightmares, sleep walking   Morphine And Related Nausea And Vomiting   Nitrofurantoin Other (See Comments)    GI upset.   Tramadol Nausea And Vomiting   Trazodone Other (See Comments)    nightmares    Consultations: None   Procedures/Studies: CT ABDOMEN PELVIS W CONTRAST  Result Date: 05/11/2022 CLINICAL DATA:  Left lower quadrant pain. EXAM: CT ABDOMEN AND  PELVIS WITH CONTRAST TECHNIQUE: Multidetector CT imaging of the abdomen and pelvis was performed using the standard protocol following bolus administration of intravenous contrast. RADIATION DOSE REDUCTION: This exam was performed according to the departmental dose-optimization program which includes automated exposure control, adjustment of the mA and/or kV according to patient size and/or use of iterative reconstruction technique. CONTRAST:  OMNIPAQUE IOHEXOL 300 MG/ML  SOLN COMPARISON:  March 24, 2022 FINDINGS: Lower chest: No acute abnormality. Hepatobiliary: No focal liver abnormality is seen. The gallbladder is normal in appearance, without evidence of gallstones, gallbladder wall thickening or pericholecystic inflammation. The common bile duct measures 8 mm in diameter. Pancreas: Unremarkable. No pancreatic ductal dilatation or surrounding inflammatory changes. Spleen: Normal in size without focal abnormality. Adrenals/Urinary Tract: Adrenal glands are unremarkable. Kidneys are normal, without renal calculi, focal lesion, or hydronephrosis. Bladder is unremarkable. Stomach/Bowel: There is a small hiatal hernia. Appendix appears normal. Stool is seen throughout the large bowel. No evidence of bowel wall thickening, distention, or inflammatory changes. Vascular/Lymphatic: No significant vascular  findings are present. No enlarged abdominal or pelvic lymph nodes. Reproductive: Status post hysterectomy. No adnexal masses. Other: No abdominal wall hernia or abnormality. No abdominopelvic ascites. Musculoskeletal: No acute or significant osseous findings. IMPRESSION: 1. Small hiatal hernia. 2. Large stool burden without evidence of bowel obstruction. Electronically Signed   By: Aram Candela M.D.   On: 05/11/2022 01:25      Subjective: Patient seen and examined at the bedside this morning.  Hemodynamically stable for discharge today to home.  Discharge Exam: Vitals:   05/11/22 1936 05/12/22 0434   BP: 111/72 104/72  Pulse: 76 73  Resp: 18 18  Temp: 97.6 F (36.4 C) 98.2 F (36.8 C)  SpO2: 95% 95%   Vitals:   05/11/22 0607 05/11/22 1042 05/11/22 1936 05/12/22 0434  BP: 118/80 111/73 111/72 104/72  Pulse: 72 80 76 73  Resp: 19 16 18 18   Temp: 97.9 F (36.6 C) 97.8 F (36.6 C) 97.6 F (36.4 C) 98.2 F (36.8 C)  TempSrc: Oral Oral Oral Oral  SpO2: 95% 92% 95% 95%  Weight:    54.4 kg    General: Pt is alert, awake, not in acute distress Cardiovascular: RRR, S1/S2 +, no rubs, no gallops Respiratory: CTA bilaterally, no wheezing, no rhonchi Abdominal: Soft, NT, ND, bowel sounds + Extremities: no edema, no cyanosis    The results of significant diagnostics from this hospitalization (including imaging, microbiology, ancillary and laboratory) are listed below for reference.     Microbiology: Recent Results (from the past 240 hour(s))  Urine Culture     Status: None   Collection Time: 05/10/22  7:36 PM   Specimen: Urine, Clean Catch  Result Value Ref Range Status   Specimen Description   Final    URINE, CLEAN CATCH Performed at [facility], [address], [place], [place]    Special Requests   Final    NONE Performed at [facility], [address] [place], [place]    Culture   Final    NO GROWTH Performed at [facility], [address] [place], [place]    Report Status 05/12/2022 FINAL  Final  Culture, blood (Routine X 2) w Reflex to ID Panel     Status: None (Preliminary result)   Collection Time: 05/11/22  8:24 AM   Specimen: BLOOD  Result Value Ref Range Status   Specimen Description   Final    BLOOD BLOOD LEFT HAND Performed at [facility], [address] [place], [place]    Special Requests   Final    BOTTLES DRAWN AEROBIC ONLY Blood Culture adequate volume Performed at [facility], [address] [place], [place]    Culture   Final    NO GROWTH < 24 HOURS Performed at [facility], [address] [place], [place]    Report Status PENDING  Incomplete  Culture, blood (Routine X 2) w Reflex to ID Panel     Status: None (Preliminary result)   Collection Time: 05/11/22  8:24 AM   Specimen: BLOOD  Result Value Ref Range Status   Specimen Description   Final    BLOOD LEFT ANTECUBITAL Performed at [facility], [address] [place], [place]    Special Requests   Final    IN PEDIATRIC BOTTLE Blood Culture results may not be optimal due to an inadequate volume of blood received  in culture bottles Performed at [facility], [address] [place] [address] [place], [place] [address]    Culture   Final    NO GROWTH < 24 HOURS Performed at [facility], [address] [place], [place] [address]    Report Status PENDING  Incomplete     Labs: BNP (last 3 results) No results for input(s): "BNP" in the last 8760 hours. Basic Metabolic Panel: Recent Labs  Lab 05/10/22 1646 05/12/22 0716  NA 136 141  K 4.0 4.2  CL 104 108  CO2 20* 27  GLUCOSE 95 108*  BUN 21 16  CREATININE 0.98 0.89  CALCIUM 9.7 8.9  MG  --  2.0   Liver Function Tests: Recent Labs  Lab 05/10/22 1646 05/12/22 0716  AST 96* 50*  ALT 92* 75*  ALKPHOS 73 57  BILITOT 0.9 0.7  PROT 7.1 6.0*  ALBUMIN 4.4 3.5   No results for input(s): "LIPASE", "AMYLASE" in the last 168 hours. No results for input(s): "AMMONIA" in the last 168 hours. CBC: Recent Labs  Lab 05/10/22 1646 05/12/22 0716  WBC 7.0 5.1  NEUTROABS  --  3.0  HGB 13.7 11.7*  HCT 40.3 35.6*  MCV 98.8 102.3*  PLT 222 178   Cardiac Enzymes: Recent Labs  Lab 05/11/22 0826  CKTOTAL 47   BNP: Invalid input(s): "POCBNP" CBG: No results for input(s): "GLUCAP" in the last 168 hours. D-Dimer No results for input(s): "DDIMER" in the last 72 hours. Hgb A1c No results for input(s): "HGBA1C" in  the last 72 hours. Lipid Profile No results for input(s): "CHOL", "HDL", "LDLCALC", "TRIG", "CHOLHDL", "LDLDIRECT" in the last 72 hours. Thyroid function studies No results for input(s): "TSH", "T4TOTAL", "T3FREE", "THYROIDAB" in the last 72 hours.  Invalid input(s): "FREET3" Anemia work up No results for input(s): "VITAMINB12", "FOLATE", "FERRITIN", "TIBC", "IRON", "RETICCTPCT" in the last 72 hours. Urinalysis    Component Value Date/Time   COLORURINE ORANGE (A) 05/10/2022 1936   APPEARANCEUR CLEAR 05/10/2022 1936   LABSPEC >1.030 (H) 05/10/2022 1936   PHURINE 5.5 05/10/2022 1936   GLUCOSEU NEGATIVE 05/10/2022 1936   HGBUR NEGATIVE 05/10/2022 1936   BILIRUBINUR NEGATIVE 05/10/2022 1936   KETONESUR 15 (A) 05/10/2022 1936   PROTEINUR NEGATIVE 05/10/2022 1936   UROBILINOGEN 2.0 (H) 01/27/2021 1649   NITRITE NEGATIVE 05/10/2022 1936   LEUKOCYTESUR SMALL (A) 05/10/2022 1936   Sepsis Labs Recent Labs  Lab 05/10/22 1646 05/12/22 0716  WBC 7.0 5.1   Microbiology Recent Results (from the past 240 hour(s))  Urine Culture     Status: None   Collection Time: 05/10/22  7:36 PM   Specimen: Urine, Clean Catch  Result Value Ref Range Status   Specimen Description   Final    URINE, CLEAN CATCH Performed at [facility], [address], [place], [place] [address]    Special Requests   Final    NONE Performed at [facility], [address], [place], [place] [address]    Culture   Final    NO GROWTH Performed at [facility], [address] [place], [place] [address]    Report Status 05/12/2022 FINAL  Final  Culture, blood (Routine X 2) w Reflex to ID Panel     Status: None (Preliminary result)   Collection Time: 05/11/22  8:24 AM   Specimen: BLOOD  Result Value Ref Range Status   Specimen Description   Final    BLOOD BLOOD LEFT HAND Performed at [facility], [address] [place], [place] [address]     Special Requests   Final    BOTTLES DRAWN AEROBIC ONLY Blood Culture adequate volume Performed at [facility], [address] [place], [place] [address]    Culture   Final    NO GROWTH < 24 HOURS Performed at [facility], [address], [place], [place] [address]    Report Status PENDING  Incomplete  Culture, blood (Routine X 2) w Reflex to ID Panel     Status: None (Preliminary result)   Collection Time: 05/11/22  8:24 AM   Specimen: BLOOD  Result Value Ref Range Status   Specimen Description   Final    BLOOD LEFT ANTECUBITAL Performed at [facility], [address] [place], [place] [address]    Special Requests   Final    IN PEDIATRIC BOTTLE Blood Culture results may not be optimal due to an inadequate volume of blood received in culture bottles Performed at [facility], [address] [place], [place] [address]    Culture   Final    NO GROWTH < 24 HOURS Performed at [facility], [address], [place], [place] [address]    Report Status PENDING  Incomplete    Please note: You were cared for by a hospitalist during your hospital stay. Once you are discharged, your primary care physician will handle any further medical issues. Please note that NO REFILLS for any discharge medications will be authorized once you are discharged, as it is imperative that you return to your primary care physician (or establish a relationship with a primary care physician if you do not have one) for your post hospital discharge needs so that they can reassess your need for medications and monitor your lab values.    Time coordinating discharge: 40 minutes  SIGNED:   Burnadette Pop, MD  [facility] 05/12/2022, 11:11 AM Pager [phone]  If 7PM-7AM, please contact night-coverage [email or web address] Password [email or web address]

## 2022-05-16 LAB — CULTURE, BLOOD (ROUTINE X 2)
Culture: NO GROWTH
Culture: NO GROWTH
Special Requests: ADEQUATE
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
## 2022-07-08 ENCOUNTER — Encounter: Payer: Self-pay | Admitting: Neurology

## 2022-07-08 ENCOUNTER — Telehealth: Payer: Self-pay

## 2022-07-08 ENCOUNTER — Ambulatory Visit (INDEPENDENT_AMBULATORY_CARE_PROVIDER_SITE_OTHER): Payer: Commercial Managed Care - HMO | Admitting: Neurology

## 2022-07-08 VITALS — BP 148/78 | HR 89 | Ht 62.0 in | Wt 116.8 lb

## 2022-07-08 DIAGNOSIS — F4310 Post-traumatic stress disorder, unspecified: Secondary | ICD-10-CM | POA: Diagnosis not present

## 2022-07-08 DIAGNOSIS — G4486 Cervicogenic headache: Secondary | ICD-10-CM

## 2022-07-08 NOTE — Telephone Encounter (Signed)
Tried calling patient, will send a copy of Dr.Jaffe notes Dr.Jaffe will not fill out letter for [employer].
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
## 2022-07-08 NOTE — Telephone Encounter (Signed)
Patient returned call to Sheena. 

## 2022-07-08 NOTE — Telephone Encounter (Signed)
Patient seen in office

## 2022-08-02 ENCOUNTER — Encounter (HOSPITAL_COMMUNITY): Payer: Self-pay | Admitting: Emergency Medicine

## 2022-08-02 ENCOUNTER — Other Ambulatory Visit: Payer: Self-pay

## 2022-08-02 ENCOUNTER — Emergency Department (HOSPITAL_COMMUNITY)
Admission: EM | Admit: 2022-08-02 | Discharge: 2022-08-03 | Disposition: A | Discharge status: Home / Self Care | Payer: Commercial Managed Care - HMO | Attending: Emergency Medicine | Admitting: Emergency Medicine

## 2022-08-02 DIAGNOSIS — R1030 Lower abdominal pain, unspecified: Secondary | ICD-10-CM | POA: Insufficient documentation

## 2022-08-02 DIAGNOSIS — N390 Urinary tract infection, site not specified: Secondary | ICD-10-CM | POA: Diagnosis not present

## 2022-08-02 DIAGNOSIS — R109 Unspecified abdominal pain: Secondary | ICD-10-CM

## 2022-08-02 DIAGNOSIS — R300 Dysuria: Secondary | ICD-10-CM | POA: Diagnosis present

## 2022-08-02 LAB — CBC WITH DIFFERENTIAL/PLATELET
Abs Immature Granulocytes: 0.02 10*3/uL (ref 0.00–0.07)
Basophils Absolute: 0 10*3/uL (ref 0.0–0.1)
Basophils Relative: 0 %
Eosinophils Absolute: 0.3 10*3/uL (ref 0.0–0.5)
Eosinophils Relative: 4 %
HCT: 41.4 % (ref 36.0–46.0)
Hemoglobin: 14 g/dL (ref 12.0–15.0)
Immature Granulocytes: 0 %
Lymphocytes Relative: 23 %
Lymphs Abs: 1.6 10*3/uL (ref 0.7–4.0)
MCH: 34 pg (ref 26.0–34.0)
MCHC: 33.8 g/dL (ref 30.0–36.0)
MCV: 100.5 fL — ABNORMAL HIGH (ref 80.0–100.0)
Monocytes Absolute: 0.7 10*3/uL (ref 0.1–1.0)
Monocytes Relative: 10 %
Neutro Abs: 4.1 10*3/uL (ref 1.7–7.7)
Neutrophils Relative %: 63 %
Platelets: 225 10*3/uL (ref 150–400)
RBC: 4.12 MIL/uL (ref 3.87–5.11)
RDW: 12.7 % (ref 11.5–15.5)
WBC: 6.7 10*3/uL (ref 4.0–10.5)
nRBC: 0 % (ref 0.0–0.2)

## 2022-08-02 LAB — COMPREHENSIVE METABOLIC PANEL
ALT: 32 U/L (ref 0–44)
AST: 31 U/L (ref 15–41)
Albumin: 3.9 g/dL (ref 3.5–5.0)
Alkaline Phosphatase: 71 U/L (ref 38–126)
Anion gap: 8 (ref 5–15)
BUN: 25 mg/dL — ABNORMAL HIGH (ref 8–23)
CO2: 23 mmol/L (ref 22–32)
Calcium: 9.3 mg/dL (ref 8.9–10.3)
Chloride: 108 mmol/L (ref 98–111)
Creatinine, Ser: 0.89 mg/dL (ref 0.44–1.00)
GFR, Estimated: >60 mL/min (ref >60)
Glucose, Bld: 115 mg/dL — ABNORMAL HIGH (ref 70–99)
Potassium: 4.1 mmol/L (ref 3.5–5.1)
Sodium: 139 mmol/L (ref 135–145)
Total Bilirubin: 0.5 mg/dL (ref 0.3–1.2)
Total Protein: 6.8 g/dL (ref 6.5–8.1)

## 2022-08-02 LAB — LACTIC ACID, PLASMA: Lactic Acid, Venous: 1.4 mmol/L (ref 0.5–1.9)

## 2022-08-02 NOTE — ED Triage Notes (Signed)
Pt in with UTI symptoms, pain to low abdomen and R flank pain. States hx of chronic UTI's, and has a urologist she sees. States she has been taking Doxycycline x 4 days and feels no better. Reporting chills, temp 98.2 in triage

## 2022-08-03 ENCOUNTER — Emergency Department (HOSPITAL_COMMUNITY): Payer: Commercial Managed Care - HMO

## 2022-08-03 DIAGNOSIS — N390 Urinary tract infection, site not specified: Secondary | ICD-10-CM | POA: Diagnosis not present

## 2022-08-03 LAB — URINALYSIS, ROUTINE W REFLEX MICROSCOPIC
Bacteria, UA: NONE SEEN
Bilirubin Urine: NEGATIVE
Glucose, UA: NEGATIVE mg/dL
Hgb urine dipstick: NEGATIVE
Ketones, ur: NEGATIVE mg/dL
Nitrite: NEGATIVE
Protein, ur: 30 mg/dL — AB
Specific Gravity, Urine: 1.031 — ABNORMAL HIGH (ref 1.005–1.030)
WBC, UA: >50 WBC/hpf — ABNORMAL HIGH (ref 0–5)
pH: 5 (ref 5.0–8.0)

## 2022-08-03 MED ORDER — PHENAZOPYRIDINE HCL 200 MG PO TABS
200.0000 mg | ORAL_TABLET | Freq: Three times a day (TID) | ORAL | Status: DC
  Administered 2022-08-03: 200 mg via ORAL

## 2022-08-03 MED ORDER — CEPHALEXIN 500 MG PO CAPS: 500.0000 mg | ORAL_CAPSULE | Freq: Two times a day (BID) | ORAL | 0 refills | Status: AC

## 2022-08-03 MED ORDER — KETOROLAC TROMETHAMINE 15 MG/ML IJ SOLN
15.0000 mg | Freq: Once | INTRAMUSCULAR | Status: AC
  Administered 2022-08-03: 15 mg via INTRAMUSCULAR
  Filled 2022-08-03: qty 1

## 2022-08-03 MED ORDER — CEPHALEXIN 500 MG PO CAPS
500.0000 mg | ORAL_CAPSULE | Freq: Once | ORAL | Status: AC
  Administered 2022-08-03: 500 mg via ORAL
  Filled 2022-08-03: qty 1

## 2022-08-03 MED ORDER — PHENAZOPYRIDINE HCL 200 MG PO TABS
200.0000 mg | ORAL_TABLET | Freq: Three times a day (TID) | ORAL | Status: DC
  Filled 2022-08-03: qty 1

## 2022-08-03 MED ORDER — PHENAZOPYRIDINE HCL 200 MG PO TABS: 200.0000 mg | ORAL_TABLET | Freq: Three times a day (TID) | ORAL | 0 refills | Status: DC

## 2022-08-03 NOTE — Discharge Instructions (Signed)
Antibiotics as prescribed and complete the full course. Pyridium as needed as prescribed for pain. Follow up with your urologist for recheck.  Culture sent out today, your urologist can follow up on this result.

## 2022-08-03 NOTE — ED Provider Notes (Signed)
[facility]-EMERGENCY DEPT Provider Note   CSN: [id number] Arrival date & time: 08/02/22  2123     History  Chief Complaint  Patient presents with   UTI symptoms    Allison Le is a 61 y.o. female.  4 female presents with complaint of urinary tract infection with dysuria and frequency for the past few days as well as right flank pain.  States she has a history of chronic UTIs, has been to urology, recently completed Cipro 5 weeks ago (called in by PCP while patient was out of town).  Started taking Oxy cyclin 4 days ago without improvement today.  States that she did have a fever of 102 2 days ago, is afebrile today without antipyretics.  Without report of nausea or vomiting, no changes in bowel habits.  History of kidney stones, feels like she has a kidney stone today.       Home Medications Prior to Admission medications   Medication Sig Start Date End Date Taking? Authorizing Provider  cephALEXin (KEFLEX) 500 MG capsule Take 1 capsule (500 mg total) by mouth 2 (two) times daily for 5 days. 08/03/22 08/08/22 Yes Jeannie Fend, PA-C  phenazopyridine (PYRIDIUM) 200 MG tablet Take 1 tablet (200 mg total) by mouth 3 (three) times daily. 08/03/22  Yes Jeannie Fend, PA-C  buPROPion (WELLBUTRIN XL) 300 MG 24 hr tablet Take 300 mg by mouth daily. 11/13/16   [provider]  cholecalciferol (VITAMIN D3) 25 MCG (1000 UT) tablet Take 1,000 Units by mouth daily.    [provider]  clonazePAM (KLONOPIN) 0.5 MG tablet Take 0.5 mg by mouth 2 (two) times daily as needed for anxiety. 12/09/16   [provider]  cyanocobalamin (VITAMIN B12) 1000 MCG/ML injection Inject 1,000 mcg into the skin every 30 (thirty) days. 02/18/22   [provider]  FLUoxetine (PROZAC) 40 MG capsule Take 80 mg by mouth daily. 11/13/16   [provider]  HYDROcodone-acetaminophen (NORCO/VICODIN) 5-325 MG tablet Take 1 tablet by mouth every 4 (four) hours  as needed. Patient taking differently: Take 1 tablet by mouth every 4 (four) hours as needed (headache). 03/29/19   Jacalyn Lefevre, MD  ibuprofen (ADVIL,MOTRIN) 200 MG tablet Take 400-600 mg by mouth every 6 (six) hours as needed.    [provider]  methylphenidate (RITALIN) 10 MG tablet Take 5 mg by mouth daily. 05/03/22   [provider]  methylphenidate 18 MG PO CR tablet Take 18 mg by mouth every morning. 05/06/22   [provider]  Multiple Vitamin (MULTIVITAMIN) capsule Take 1 capsule by mouth daily.    [provider]  Polyethylene Glycol 400 0.25 % SOLN Apply 1 drop to eye daily.    [provider]  PREMARIN vaginal cream Place 0.5 mg vaginally in the morning and at bedtime. 05/06/22   [provider]  triamcinolone cream (KENALOG) 0.1 % Apply 1 Application topically daily as needed. 02/11/22   [provider]      Allergies    Sumatriptan, Zolpidem, Morphine and related, Nitrofurantoin, Tramadol, and Trazodone    Review of Systems   Review of Systems Negative except as per HPI Physical Exam Updated Vital Signs BP 126/84 (BP Location: Right Arm)   Pulse 77   Temp 98.8 F (37.1 C) (Oral)   Resp 16   Ht 5\' 2"  (1.575 m)   Wt 50.3 kg   SpO2 97%   BMI 20.30 kg/m  Physical Exam Vitals and nursing note  reviewed.  Constitutional:      General: She is not in acute distress.    Appearance: She is well-developed. She is not diaphoretic.  HENT:     Head: Normocephalic and atraumatic.  Cardiovascular:     Rate and Rhythm: Normal rate and regular rhythm.     Heart sounds: Normal heart sounds.  Pulmonary:     Effort: Pulmonary effort is normal.     Breath sounds: Normal breath sounds.  Abdominal:     Palpations: Abdomen is soft.     Tenderness: There is abdominal tenderness in the suprapubic area. There is no right CVA tenderness or left CVA tenderness.  Skin:    General: Skin is warm and dry.     Findings: No erythema  or rash.  Neurological:     Mental Status: She is alert and oriented to person, place, and time.  Psychiatric:        Behavior: Behavior normal.     ED Results / Procedures / Treatments   Labs (all labs ordered are listed, but only abnormal results are displayed) Labs Reviewed  COMPREHENSIVE METABOLIC PANEL - Abnormal; Notable for the following components:      Result Value   Glucose, Bld 115 (*)    BUN 25 (*)    All other components within normal limits  CBC WITH DIFFERENTIAL/PLATELET - Abnormal; Notable for the following components:   MCV 100.5 (*)    All other components within normal limits  URINALYSIS, ROUTINE W REFLEX MICROSCOPIC - Abnormal; Notable for the following components:   APPearance CLOUDY (*)    Specific Gravity, Urine 1.031 (*)    Protein, ur 30 (*)    Leukocytes,Ua LARGE (*)    WBC, UA >50 (*)    All other components within normal limits  URINE CULTURE  LACTIC ACID, PLASMA    EKG None  Radiology CT Renal Stone Study  Result Date: 08/03/2022 CLINICAL DATA:  Right flank pain and dark urine. EXAM: CT ABDOMEN AND PELVIS WITHOUT CONTRAST TECHNIQUE: Multidetector CT imaging of the abdomen and pelvis was performed following the standard protocol without IV contrast. RADIATION DOSE REDUCTION: This exam was performed according to the departmental dose-optimization program which includes automated exposure control, adjustment of the mA and/or kV according to patient size and/or use of iterative reconstruction technique. COMPARISON:  May 11, 2022 FINDINGS: Lower chest: No acute abnormality. Hepatobiliary: No focal liver abnormality is seen. No gallstones, gallbladder wall thickening, or biliary dilatation. Pancreas: Unremarkable. No pancreatic ductal dilatation or surrounding inflammatory changes. Spleen: Normal in size without focal abnormality. Adrenals/Urinary Tract: Adrenal glands are unremarkable. Kidneys are normal, without renal calculi, focal lesion, or  hydronephrosis. Bladder is unremarkable. Stomach/Bowel: There is a small hiatal hernia. This is mildly increased in size when compared to the prior study. Appendix appears normal. Stool is seen throughout the large bowel. No evidence of bowel wall thickening, distention, or inflammatory changes. Vascular/Lymphatic: No significant vascular findings are present. No enlarged abdominal or pelvic lymph nodes. Reproductive: Status post hysterectomy. No adnexal masses. Other: No abdominal wall hernia or abnormality. No abdominopelvic ascites. Musculoskeletal: No acute or significant osseous findings. IMPRESSION: 1. Small hiatal hernia. 2. No evidence of an acute or active process within the abdomen or pelvis. Electronically Signed   By: Virgina Norfolk M.D.   On: 08/03/2022 03:05    Procedures Procedures    Medications Ordered in ED Medications  phenazopyridine (PYRIDIUM) tablet 200 mg (200 mg Oral Given 08/03/22 0329)  cephALEXin (KEFLEX) capsule  500 mg (500 mg Oral Given 08/03/22 0329)  ketorolac (TORADOL) 15 MG/ML injection 15 mg (15 mg Intramuscular Given 08/03/22 0329)    ED Course/ Medical Decision Making/ A&P                           Medical Decision Making Amount and/or Complexity of Data Reviewed Labs: ordered. Radiology: ordered.  Risk Prescription drug management.   This patient presents to the ED for concern of dysuria, flank pain, this involves an extensive number of treatment options, and is a complaint that carries with it a high risk of complications and morbidity.  The differential diagnosis includes but not limited to UTI, pyelonephritis, kidney stone   Co morbidities that complicate the patient evaluation  Colitis, IBS, hyperlipidemia, recurrent UTI   Additional history obtained:  External records from outside source obtained and reviewed including prior urine cultures on file which were negative/insignificant growth    Lab Tests:  I Ordered, and personally  interpreted labs.  The pertinent results include:  CBC with normal WBC, lactic acid normal at 1.4. CMP without significant findings. UA with protein, large leukocytes, >50WBC, ca oxalate crystals, NO bacteria.    Imaging Studies ordered:  I ordered imaging studies including CT stone study  I independently visualized and interpreted imaging which showed no stones, no acute abnormality  I agree with the radiologist interpretation  Problem List / ED Course / Critical interventions / Medication management  61 year old female with complaint of dysuria, frequency and flank pain with history of kidney stones and UTI, on doxy that she had left over without improvement. Labs reassuring with exception of UA which suggests possible UTI with oxylate crystals. CT stone study is negative for stone or other acute abnormality. Urine cultures reviewed, no growth. Plan is to send for culture, will treat with Keflex, pyridium. Follow up with urology for recheck and cx follow up.  I ordered medication including toradol, keflex, pyridium  for UTI, flank pain  Reevaluation of the patient after these medicines showed that the patient stayed the same, treatment provided at time of DC.  I have reviewed the patients home medicines and have made adjustments as needed   Social Determinants of Health:  Sees urology   Test / Admission - Considered:  Work up with possible UTI, lactic normal, WBC normal, afebrile, no stone on CT. Stable for dc and op follow up.          Final Clinical Impression(s) / ED Diagnoses Final diagnoses:  Urinary tract infection in female  Flank pain    Rx / DC Orders ED Discharge Orders          Ordered    cephALEXin (KEFLEX) 500 MG capsule  2 times daily        08/03/22 0337    phenazopyridine (PYRIDIUM) 200 MG tablet  3 times daily        08/03/22 0337              Jeannie Fend, PA-C 08/03/22 0441    Palumbo, April, MD 08/03/22 [phone]

## 2022-08-04 LAB — URINE CULTURE: Culture: <10000 — AB

## 2022-12-05 ENCOUNTER — Ambulatory Visit: Payer: Medicaid Other | Attending: Urology | Admitting: Physical Therapy
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
## 2023-02-04 ENCOUNTER — Encounter: Payer: Self-pay | Admitting: Physical Therapy

## 2023-02-04 ENCOUNTER — Other Ambulatory Visit: Payer: Self-pay

## 2023-02-04 ENCOUNTER — Ambulatory Visit: Payer: Medicare HMO | Attending: Urology | Admitting: Physical Therapy

## 2023-02-04 DIAGNOSIS — R293 Abnormal posture: Secondary | ICD-10-CM | POA: Diagnosis present

## 2023-02-04 DIAGNOSIS — M6281 Muscle weakness (generalized): Secondary | ICD-10-CM | POA: Diagnosis present

## 2023-02-04 DIAGNOSIS — R279 Unspecified lack of coordination: Secondary | ICD-10-CM

## 2023-02-04 DIAGNOSIS — M62838 Other muscle spasm: Secondary | ICD-10-CM | POA: Insufficient documentation
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
## 2023-02-13 ENCOUNTER — Telehealth: Payer: Self-pay | Admitting: Neurology

## 2023-02-13 DIAGNOSIS — M542 Cervicalgia: Secondary | ICD-10-CM

## 2023-02-13 NOTE — Telephone Encounter (Signed)
Patient wants to get  a referral to the [facility]  speciality rehab

## 2023-02-18 NOTE — Telephone Encounter (Signed)
Per Dr.Jaffe okay to send referral to PT for Neck pain and Headaches.

## 2023-02-18 NOTE — Telephone Encounter (Signed)
Per Patient she would like to have Physical therapy for her neck pain and headaches.

## 2023-03-13 ENCOUNTER — Encounter: Payer: Medicare Other | Admitting: Physical Therapy

## 2023-03-17 ENCOUNTER — Ambulatory Visit: Payer: Medicare HMO | Attending: Urology | Admitting: Physical Therapy

## 2023-03-17 DIAGNOSIS — R293 Abnormal posture: Secondary | ICD-10-CM | POA: Diagnosis present

## 2023-03-17 DIAGNOSIS — R279 Unspecified lack of coordination: Secondary | ICD-10-CM | POA: Diagnosis present

## 2023-03-17 DIAGNOSIS — M6281 Muscle weakness (generalized): Secondary | ICD-10-CM | POA: Diagnosis present
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
## 2023-03-24 ENCOUNTER — Encounter: Payer: Medicare Other | Admitting: Physical Therapy

## 2023-03-26 ENCOUNTER — Encounter: Payer: Self-pay | Admitting: Physical Therapy

## 2023-03-26 ENCOUNTER — Other Ambulatory Visit: Payer: Self-pay

## 2023-03-26 ENCOUNTER — Ambulatory Visit: Payer: Medicare HMO | Attending: Neurology | Admitting: Physical Therapy

## 2023-03-26 DIAGNOSIS — R252 Cramp and spasm: Secondary | ICD-10-CM | POA: Diagnosis present

## 2023-03-26 DIAGNOSIS — M6281 Muscle weakness (generalized): Secondary | ICD-10-CM | POA: Insufficient documentation

## 2023-03-26 DIAGNOSIS — M542 Cervicalgia: Secondary | ICD-10-CM | POA: Diagnosis present

## 2023-03-26 DIAGNOSIS — R293 Abnormal posture: Secondary | ICD-10-CM | POA: Diagnosis present

## 2023-03-26 DIAGNOSIS — R2681 Unsteadiness on feet: Secondary | ICD-10-CM | POA: Insufficient documentation
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
## 2023-03-31 ENCOUNTER — Ambulatory Visit: Payer: Medicare HMO | Attending: Neurology | Admitting: Physical Therapy

## 2023-03-31 ENCOUNTER — Encounter: Payer: Medicare Other | Admitting: Physical Therapy

## 2023-03-31 DIAGNOSIS — R252 Cramp and spasm: Secondary | ICD-10-CM | POA: Insufficient documentation

## 2023-03-31 DIAGNOSIS — R2681 Unsteadiness on feet: Secondary | ICD-10-CM | POA: Diagnosis present

## 2023-03-31 DIAGNOSIS — M62838 Other muscle spasm: Secondary | ICD-10-CM | POA: Diagnosis present

## 2023-03-31 DIAGNOSIS — R279 Unspecified lack of coordination: Secondary | ICD-10-CM | POA: Diagnosis present

## 2023-03-31 DIAGNOSIS — M6281 Muscle weakness (generalized): Secondary | ICD-10-CM | POA: Diagnosis present

## 2023-03-31 DIAGNOSIS — R293 Abnormal posture: Secondary | ICD-10-CM | POA: Diagnosis present

## 2023-03-31 DIAGNOSIS — M542 Cervicalgia: Secondary | ICD-10-CM | POA: Insufficient documentation
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
## 2023-04-07 ENCOUNTER — Encounter: Payer: Medicare Other | Admitting: Physical Therapy

## 2023-04-14 ENCOUNTER — Encounter: Payer: Medicare Other | Admitting: Physical Therapy

## 2023-04-16 ENCOUNTER — Ambulatory Visit: Payer: Medicare HMO

## 2023-04-16 DIAGNOSIS — R252 Cramp and spasm: Secondary | ICD-10-CM

## 2023-04-16 DIAGNOSIS — M542 Cervicalgia: Secondary | ICD-10-CM | POA: Diagnosis not present

## 2023-04-16 DIAGNOSIS — R293 Abnormal posture: Secondary | ICD-10-CM

## 2023-04-16 DIAGNOSIS — M6281 Muscle weakness (generalized): Secondary | ICD-10-CM
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
## 2023-04-21 ENCOUNTER — Encounter: Payer: Medicare Other | Admitting: Physical Therapy

## 2023-04-23 ENCOUNTER — Ambulatory Visit: Payer: Medicare HMO

## 2023-04-23 DIAGNOSIS — R293 Abnormal posture: Secondary | ICD-10-CM

## 2023-04-23 DIAGNOSIS — R252 Cramp and spasm: Secondary | ICD-10-CM

## 2023-04-23 DIAGNOSIS — M542 Cervicalgia: Secondary | ICD-10-CM | POA: Diagnosis not present

## 2023-04-23 DIAGNOSIS — M6281 Muscle weakness (generalized): Secondary | ICD-10-CM
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
## 2023-04-28 ENCOUNTER — Ambulatory Visit: Payer: Medicare HMO | Admitting: Physical Therapy

## 2023-04-28 ENCOUNTER — Encounter: Payer: Medicare Other | Admitting: Physical Therapy

## 2023-04-30 ENCOUNTER — Ambulatory Visit: Payer: Medicare HMO | Admitting: Physical Therapy

## 2023-04-30 DIAGNOSIS — M6281 Muscle weakness (generalized): Secondary | ICD-10-CM

## 2023-04-30 DIAGNOSIS — M542 Cervicalgia: Secondary | ICD-10-CM | POA: Diagnosis not present
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
## 2023-05-05 ENCOUNTER — Encounter: Payer: Medicare Other | Admitting: Physical Therapy

## 2023-05-06 ENCOUNTER — Ambulatory Visit: Payer: Medicare HMO | Attending: Urology

## 2023-05-06 DIAGNOSIS — R2681 Unsteadiness on feet: Secondary | ICD-10-CM | POA: Insufficient documentation

## 2023-05-06 DIAGNOSIS — R252 Cramp and spasm: Secondary | ICD-10-CM

## 2023-05-06 DIAGNOSIS — R293 Abnormal posture: Secondary | ICD-10-CM

## 2023-05-06 DIAGNOSIS — M6281 Muscle weakness (generalized): Secondary | ICD-10-CM

## 2023-05-06 DIAGNOSIS — M62838 Other muscle spasm: Secondary | ICD-10-CM | POA: Diagnosis present

## 2023-05-06 DIAGNOSIS — M542 Cervicalgia: Secondary | ICD-10-CM
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
## 2023-05-08 ENCOUNTER — Ambulatory Visit: Payer: Medicare HMO

## 2023-05-13 ENCOUNTER — Ambulatory Visit: Payer: Medicare HMO

## 2023-05-15 ENCOUNTER — Ambulatory Visit: Payer: Medicare HMO

## 2023-05-19 ENCOUNTER — Encounter: Payer: Self-pay | Admitting: Plastic Surgery

## 2023-05-19 ENCOUNTER — Ambulatory Visit (INDEPENDENT_AMBULATORY_CARE_PROVIDER_SITE_OTHER): Payer: Self-pay | Admitting: Plastic Surgery

## 2023-05-19 DIAGNOSIS — Z719 Counseling, unspecified: Secondary | ICD-10-CM | POA: Insufficient documentation

## 2023-05-19 NOTE — Progress Notes (Signed)
Botulinum Toxin Procedure Note  Procedure: Cosmetic botulinum toxin   Pre-operative Diagnosis: Dynamic rhytides   Post-operative Diagnosis: Same  Complications:  None  Brief history: The patient desires botulinum toxin injection of her forehead. I discussed with the patient this proposed procedure of botulinum toxin injections, which is customized depending on the particular needs of the patient. It is performed on facial rhytids as a temporary correction. The alternatives were discussed with the patient. The risks were addressed including bleeding, scarring, infection, damage to deeper structures, asymmetry, and chronic pain, which may occur infrequently after a procedure. The individual's choice to undergo a surgical procedure is based on the comparison of risks to potential benefits. Other risks include unsatisfactory results, brow ptosis, eyelid ptosis, allergic reaction, temporary paralysis, which should go away with time, bruising, blurring disturbances and delayed healing. Botulinum toxin injections do not arrest the aging process or produce permanent tightening of the eyelid.  Operative intervention maybe necessary to maintain the results of a blepharoplasty or botulinum toxin. The patient understands and wishes to proceed.  Procedure: The area was prepped with alcohol and dried with a clean gauze. Using a clean technique, the botulinum toxin was diluted with 1.25 cc of preservative-free normal saline which was slowly injected with an 18 gauge needle in a tuberculin syringes.  A 32 gauge needles were then used to inject the botulinum toxin. This mixture allow for an aliquot of 4 units per 0.1 cc in each injection site.    Subsequently the mixture was injected in the lateral periorbital area.   A total of 12 Units of botulinum toxin was used. She still had dysport that was effective from 2-3 months ago. No complications were noted. Light pressure was held for 5 minutes. She was instructed  explicitly in post-operative care.  Botox LOT:  [id number]

## 2023-05-19 NOTE — Addendum Note (Signed)
Addended by: Peggye Form on: 05/19/2023 11:15 AM   Modules accepted: Orders

## 2023-05-20 ENCOUNTER — Ambulatory Visit: Payer: Medicare HMO

## 2023-05-22 ENCOUNTER — Ambulatory Visit: Payer: Medicare HMO

## 2023-05-22 DIAGNOSIS — M62838 Other muscle spasm: Secondary | ICD-10-CM

## 2023-05-22 DIAGNOSIS — M6281 Muscle weakness (generalized): Secondary | ICD-10-CM

## 2023-05-22 DIAGNOSIS — R293 Abnormal posture: Secondary | ICD-10-CM

## 2023-05-22 DIAGNOSIS — R2681 Unsteadiness on feet: Secondary | ICD-10-CM

## 2023-05-22 DIAGNOSIS — M542 Cervicalgia: Secondary | ICD-10-CM

## 2023-05-22 DIAGNOSIS — R252 Cramp and spasm: Secondary | ICD-10-CM
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
## 2023-05-27 ENCOUNTER — Ambulatory Visit: Payer: Medicare HMO

## 2023-05-29 ENCOUNTER — Ambulatory Visit: Payer: Medicare HMO
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
## 2023-07-02 ENCOUNTER — Ambulatory Visit: Payer: Medicare HMO | Admitting: Neurology

## 2023-07-02 ENCOUNTER — Encounter: Payer: Self-pay | Admitting: Neurology

## 2023-07-02 VITALS — BP 109/76 | HR 99 | Ht 62.0 in | Wt 113.0 lb

## 2023-07-02 DIAGNOSIS — R42 Dizziness and giddiness: Secondary | ICD-10-CM | POA: Diagnosis not present

## 2023-07-02 DIAGNOSIS — R2689 Other abnormalities of gait and mobility: Secondary | ICD-10-CM | POA: Diagnosis not present

## 2023-07-02 DIAGNOSIS — R270 Ataxia, unspecified: Secondary | ICD-10-CM

## 2023-07-02 DIAGNOSIS — R296 Repeated falls: Secondary | ICD-10-CM

## 2023-07-02 NOTE — Patient Instructions (Signed)
Check MRI of brain Further recommendation spending results.

## 2023-07-09 ENCOUNTER — Telehealth: Payer: Self-pay | Admitting: Neurology

## 2023-07-09 ENCOUNTER — Ambulatory Visit: Payer: Medicare HMO | Attending: Urology

## 2023-07-09 DIAGNOSIS — R252 Cramp and spasm: Secondary | ICD-10-CM | POA: Insufficient documentation

## 2023-07-09 DIAGNOSIS — M542 Cervicalgia: Secondary | ICD-10-CM | POA: Insufficient documentation

## 2023-07-09 DIAGNOSIS — M6281 Muscle weakness (generalized): Secondary | ICD-10-CM | POA: Insufficient documentation

## 2023-07-09 DIAGNOSIS — R293 Abnormal posture: Secondary | ICD-10-CM | POA: Insufficient documentation

## 2023-07-09 DIAGNOSIS — R2681 Unsteadiness on feet: Secondary | ICD-10-CM | POA: Insufficient documentation

## 2023-07-09 DIAGNOSIS — M62838 Other muscle spasm: Secondary | ICD-10-CM | POA: Insufficient documentation

## 2023-07-09 NOTE — Telephone Encounter (Signed)
Patient given [facility] phone number to schedule her MRI.

## 2023-07-09 NOTE — Telephone Encounter (Signed)
Pt was calling to see when her MRI had been scheduled

## 2023-08-04 ENCOUNTER — Encounter: Payer: Self-pay | Admitting: Neurology

## 2023-08-05 ENCOUNTER — Inpatient Hospital Stay
Admission: RE | Admit: 2023-08-05 | Discharge: 2023-08-05 | Discharge status: Home / Self Care | Payer: Medicare HMO | Source: Ambulatory Visit | Attending: Neurology

## 2023-08-05 DIAGNOSIS — R296 Repeated falls: Secondary | ICD-10-CM

## 2023-08-05 DIAGNOSIS — R270 Ataxia, unspecified: Secondary | ICD-10-CM

## 2023-08-05 DIAGNOSIS — R42 Dizziness and giddiness: Secondary | ICD-10-CM

## 2023-08-11 ENCOUNTER — Telehealth: Payer: Self-pay | Admitting: Neurology

## 2023-08-11 NOTE — Telephone Encounter (Signed)
Patient is calling to see get the results of the MRI  please call

## 2023-08-11 NOTE — Telephone Encounter (Signed)
Advise patient MRI are behind right to be read.

## 2023-08-12 NOTE — Telephone Encounter (Signed)
Spoke to the reading room, MRI will be sent to be read.

## 2023-08-12 NOTE — Telephone Encounter (Signed)
Patient advised of her MRI results, MRI of brain reviewed.  It shows chronic age-related changes but nothing concerning that would cause balance problems.  I unfortunately do not have anything else to recommend.

## 2024-02-04 ENCOUNTER — Telehealth: Payer: Self-pay | Admitting: Neurology

## 2024-02-04 NOTE — Telephone Encounter (Signed)
 Patient advised, of Dr.Jaffe note, If patient signs release, we can send them my notes

## 2024-02-04 NOTE — Telephone Encounter (Signed)
 Pt. Law firm called to discus Pt. Historu with Dr. Festus Hubert needs call back with notes and info on fee if applicable
